# Patient Record
Sex: Female | Born: 1990 | Race: Black or African American | Hispanic: No | Marital: Single | Smoking: Never smoker
Health system: Southern US, Community
[De-identification: ages and names within clinical notes are randomized; demographics above are authoritative.]

## PROBLEM LIST (undated history)

## (undated) DIAGNOSIS — Z789 Other specified health status: Secondary | ICD-10-CM

## (undated) HISTORY — PX: NO PAST SURGERIES: SHX2092

---
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
## 2012-06-14 ENCOUNTER — Emergency Department (INDEPENDENT_AMBULATORY_CARE_PROVIDER_SITE_OTHER)
Admission: EM | Admit: 2012-06-14 | Discharge: 2012-06-14 | Disposition: A | Discharge status: Home / Self Care | Payer: Medicaid Other | Source: Home / Self Care | Attending: Emergency Medicine | Admitting: Emergency Medicine

## 2012-06-14 ENCOUNTER — Encounter (HOSPITAL_COMMUNITY): Payer: Self-pay

## 2012-06-14 DIAGNOSIS — N10 Acute tubulo-interstitial nephritis: Secondary | ICD-10-CM

## 2012-06-14 LAB — POCT URINALYSIS DIP (DEVICE)
Nitrite: NEGATIVE
Protein, ur: 100 mg/dL — AB
Urobilinogen, UA: 0.2 mg/dL (ref 0.0–1.0)
pH: 6 (ref 5.0–8.0)

## 2012-06-14 LAB — POCT I-STAT, CHEM 8
Calcium, Ion: 1.23 mmol/L (ref 1.12–1.23)
Chloride: 107 mEq/L (ref 96–112)
HCT: 42 % (ref 36.0–46.0)
Sodium: 142 mEq/L (ref 135–145)

## 2012-06-14 MED ORDER — CEFTRIAXONE SODIUM 1 G IJ SOLR
INTRAMUSCULAR | Status: AC
  Filled 2012-06-14: qty 10

## 2012-06-14 MED ORDER — HYDROCODONE-ACETAMINOPHEN 5-325 MG PO TABS: 2.0000 | ORAL_TABLET | ORAL | Status: DC | PRN

## 2012-06-14 MED ORDER — CEPHALEXIN 500 MG PO CAPS: 500.0000 mg | ORAL_CAPSULE | Freq: Four times a day (QID) | ORAL | Status: AC

## 2012-06-14 MED ORDER — LIDOCAINE HCL (PF) 1 % IJ SOLN
INTRAMUSCULAR | Status: AC
  Filled 2012-06-14: qty 5

## 2012-06-14 MED ORDER — ONDANSETRON HCL 4 MG PO TABS: 4.0000 mg | ORAL_TABLET | Freq: Three times a day (TID) | ORAL | Status: DC | PRN

## 2012-06-14 MED ORDER — CEFTRIAXONE SODIUM 1 G IJ SOLR
1.0000 g | Freq: Once | INTRAMUSCULAR | Status: AC
  Administered 2012-06-14: 1 g via INTRAMUSCULAR

## 2012-06-14 NOTE — ED Notes (Signed)
C/o pain in her right side, flank for few days, accompanied by vomiting and fever; NAD at present

## 2012-06-14 NOTE — ED Provider Notes (Signed)
History     CSN: [id number]  Arrival date & time 06/14/12  1614   First MD Initiated Contact with Patient 06/14/12 1701      Chief Complaint  Patient presents with  . Flank Pain    (Consider location/radiation/quality/duration/timing/severity/associated sxs/prior treatment) HPI Comments: Patient presents to urgent care this afternoon complaining of discomfort and pressure with urination for the last 3-4 days. Since yesterday she's been noticing some discomfort on her right flank area. She has been feeling worse throughout the day now feeling nauseous and vomited at one and having tactile fevers at home. She continues to have discomfort and pressure time she urinates. Patient denies any diarrheas, shortness of breath, or any recent falls or injuries. Patient also denies any vaginal discharge or bleeding. Patient denies any concerns of a potential STD exposure  Patient is a 22 y.o. female presenting with flank pain. The history is provided by the patient and a parent.  Flank Pain This is a new problem. The current episode started 2 days ago. The problem occurs constantly. The problem has been gradually worsening. Associated symptoms include abdominal pain. Pertinent negatives include no chest pain, no headaches and no shortness of breath. Exacerbated by: Urination. She has tried nothing for the symptoms. The treatment provided no relief.    History reviewed. No pertinent past medical history.  History reviewed. No pertinent past surgical history.  History reviewed. No pertinent family history.  History  Substance Use Topics  . Smoking status: Not on file  . Smokeless tobacco: Not on file  . Alcohol Use: Not on file    OB History    Grav Para Term Preterm Abortions TAB SAB Ect Mult Living                  Review of Systems  Constitutional: Positive for fever, chills and activity change. Negative for unexpected weight change.  Respiratory: Negative for shortness of breath.     Cardiovascular: Negative for chest pain.  Gastrointestinal: Positive for abdominal pain. Negative for nausea, vomiting, diarrhea, constipation, blood in stool, anal bleeding and rectal pain.  Genitourinary: Positive for dysuria and flank pain. Negative for hematuria, vaginal bleeding, vaginal discharge, difficulty urinating, vaginal pain, menstrual problem and dyspareunia.  Skin: Negative for rash.  Neurological: Negative for headaches.    Allergies  Penicillins  Home Medications   Current Outpatient Rx  Name  Route  Sig  Dispense  Refill  . CEPHALEXIN 500 MG PO CAPS   Oral   Take 1 capsule (500 mg total) by mouth 4 (four) times daily.   40 capsule   0   . HYDROCODONE-ACETAMINOPHEN 5-325 MG PO TABS   Oral   Take 2 tablets by mouth every 4 (four) hours as needed for pain.   10 tablet   0   . ONDANSETRON HCL 4 MG PO TABS   Oral   Take 1 tablet (4 mg total) by mouth every 8 (eight) hours as needed for nausea.   20 tablet   0     BP 117/76  Pulse 84  Temp 98.1 F (36.7 C) (Oral)  Resp 16  SpO2 98%  LMP 06/04/2012  Physical Exam  Nursing note and vitals reviewed. Constitutional: She appears well-developed and well-nourished.  Pulmonary/Chest: Effort normal.  Abdominal: Soft. She exhibits no distension and no mass. There is no hepatosplenomegaly or hepatomegaly. There is tenderness in the suprapubic area. There is CVA tenderness. There is no rebound and no guarding.    Neurological:  She is alert.  Skin: No rash noted. No erythema.    ED Course  Procedures (including critical care time)  Labs Reviewed  POCT URINALYSIS DIP (DEVICE) - Abnormal; Notable for the following:    Bilirubin Urine SMALL (*)     Ketones, ur >=160 (*)     Hgb urine dipstick LARGE (*)     Protein, ur 100 (*)     Leukocytes, UA TRACE (*)  Biochemical Testing Only. Please order routine urinalysis from main lab if confirmatory testing is needed.   All other components within normal limits   POCT PREGNANCY, URINE  POCT I-STAT, CHEM 8  URINE CULTURE   No results found.   1. Acute pyelonephritis    Negative pregnancy test -   Strangely CBC with differential ordered an only hematocrit and hemoglobin available within normal   MDM  Symptoms and exam consistent with early pyelonephritis. Patient has been provided with a gram of citrus on. Is able to tolerate oral fluids. Normal kidney function. Will followup within next 24-48 hours. Patient instructed to return tomorrow for a brief recheck and I will followup on Sunday. Cultures have been sent and patient has been instructed to continue with Keflex tonight. Patient and mother were advised and instructed that if worsening symptoms including increased pain, vomiting or unable to take the prescribed medicines she should go immediately to the emergency department for further treatment     Jimmie Molly, MD 06/14/12 2023

## 2012-06-16 ENCOUNTER — Emergency Department (HOSPITAL_COMMUNITY): Payer: Medicaid Other

## 2012-06-16 ENCOUNTER — Encounter (HOSPITAL_COMMUNITY): Payer: Self-pay | Admitting: *Deleted

## 2012-06-16 ENCOUNTER — Emergency Department (HOSPITAL_COMMUNITY)
Admission: EM | Admit: 2012-06-16 | Discharge: 2012-06-16 | Disposition: A | Discharge status: Home / Self Care | Payer: Medicaid Other | Attending: Emergency Medicine | Admitting: Emergency Medicine

## 2012-06-16 DIAGNOSIS — N390 Urinary tract infection, site not specified: Secondary | ICD-10-CM | POA: Insufficient documentation

## 2012-06-16 DIAGNOSIS — Z3202 Encounter for pregnancy test, result negative: Secondary | ICD-10-CM | POA: Insufficient documentation

## 2012-06-16 DIAGNOSIS — R3915 Urgency of urination: Secondary | ICD-10-CM | POA: Insufficient documentation

## 2012-06-16 DIAGNOSIS — R6883 Chills (without fever): Secondary | ICD-10-CM | POA: Insufficient documentation

## 2012-06-16 DIAGNOSIS — N949 Unspecified condition associated with female genital organs and menstrual cycle: Secondary | ICD-10-CM | POA: Insufficient documentation

## 2012-06-16 DIAGNOSIS — R112 Nausea with vomiting, unspecified: Secondary | ICD-10-CM | POA: Insufficient documentation

## 2012-06-16 DIAGNOSIS — M549 Dorsalgia, unspecified: Secondary | ICD-10-CM | POA: Insufficient documentation

## 2012-06-16 DIAGNOSIS — N200 Calculus of kidney: Secondary | ICD-10-CM | POA: Insufficient documentation

## 2012-06-16 LAB — URINE MICROSCOPIC-ADD ON

## 2012-06-16 LAB — CBC WITH DIFFERENTIAL/PLATELET
Basophils Absolute: 0 10*3/uL (ref 0.0–0.1)
HCT: 40.5 % (ref 36.0–46.0)
Lymphocytes Relative: 31 % (ref 12–46)
Monocytes Absolute: 0.4 10*3/uL (ref 0.1–1.0)
Neutro Abs: 2.6 10*3/uL (ref 1.7–7.7)
Platelets: 264 10*3/uL (ref 150–400)
RBC: 4.54 MIL/uL (ref 3.87–5.11)
RDW: 11.6 % (ref 11.5–15.5)
WBC: 4.4 10*3/uL (ref 4.0–10.5)

## 2012-06-16 LAB — COMPREHENSIVE METABOLIC PANEL
ALT: 19 U/L (ref 0–35)
AST: 26 U/L (ref 0–37)
CO2: 27 mEq/L (ref 19–32)
Chloride: 102 mEq/L (ref 96–112)
GFR calc non Af Amer: >90 mL/min (ref >90)
Sodium: 140 mEq/L (ref 135–145)
Total Bilirubin: 0.4 mg/dL (ref 0.3–1.2)

## 2012-06-16 LAB — POCT PREGNANCY, URINE: Preg Test, Ur: NEGATIVE

## 2012-06-16 LAB — URINE CULTURE: Culture: NO GROWTH

## 2012-06-16 LAB — URINALYSIS, ROUTINE W REFLEX MICROSCOPIC
Protein, ur: 100 mg/dL — AB
Urobilinogen, UA: 1 mg/dL (ref 0.0–1.0)

## 2012-06-16 LAB — WET PREP, GENITAL: Trich, Wet Prep: NONE SEEN

## 2012-06-16 LAB — LIPASE, BLOOD: Lipase: 42 U/L (ref 11–59)

## 2012-06-16 MED ORDER — ONDANSETRON HCL 4 MG PO TABS: 4.0000 mg | ORAL_TABLET | Freq: Four times a day (QID) | ORAL | Status: DC

## 2012-06-16 MED ORDER — HYDROMORPHONE HCL PF 1 MG/ML IJ SOLN
1.0000 mg | Freq: Once | INTRAMUSCULAR | Status: AC
Start: 2012-06-16 — End: 2012-06-16
  Administered 2012-06-16: 1 mg via INTRAVENOUS
  Filled 2012-06-16: qty 1

## 2012-06-16 MED ORDER — SODIUM CHLORIDE 0.9 % IV BOLUS (SEPSIS)
1000.0000 mL | Freq: Once | INTRAVENOUS | Status: AC
  Administered 2012-06-16: 1000 mL via INTRAVENOUS

## 2012-06-16 MED ORDER — ONDANSETRON HCL 4 MG/2ML IJ SOLN
4.0000 mg | Freq: Once | INTRAMUSCULAR | Status: AC
  Administered 2012-06-16: 4 mg via INTRAVENOUS
  Filled 2012-06-16: qty 2

## 2012-06-16 MED ORDER — OXYCODONE-ACETAMINOPHEN 5-325 MG PO TABS: 1.0000 | ORAL_TABLET | ORAL | Status: DC | PRN

## 2012-06-16 MED ORDER — DIPHENHYDRAMINE HCL 50 MG/ML IJ SOLN
25.0000 mg | Freq: Once | INTRAMUSCULAR | Status: AC
  Administered 2012-06-16: 25 mg via INTRAVENOUS
  Filled 2012-06-16: qty 1

## 2012-06-16 NOTE — ED Provider Notes (Signed)
History     CSN: [id number]  Arrival date & time 06/16/12  1211   First MD Initiated Contact with Patient 06/16/12 1220      Chief Complaint  Patient presents with  . Abdominal Pain    (Consider location/radiation/quality/duration/timing/severity/associated sxs/prior treatment) HPI Comments: 22 year old female presents to the ED with her mom after being seen at Urgent Care on 1/3 diagnosed with pyelonephritis. She was put on Keflex which she has started. Since leaving [facility] her symptoms have worsened. She developed lower abdominal pain, nausea, vomiting, and a "wierd" feeling in her vagina. Abdominal pain sharp in nature radiating to the right side of her back rated 10/10. Denies vaginal bleeding or discharge. No increased urinary frequency or urgency. Admits to chills. Pain and nausea medication are not providing relief.  The history is provided by the patient and a parent.    History reviewed. No pertinent past medical history.  History reviewed. No pertinent past surgical history.  No family history on file.  History  Substance Use Topics  . Smoking status: Never Smoker   . Smokeless tobacco: Not on file  . Alcohol Use: No    OB History    Grav Para Term Preterm Abortions TAB SAB Ect Mult Living                  Review of Systems  Constitutional: Positive for chills. Negative for fever.  Gastrointestinal: Positive for nausea, vomiting and abdominal pain.  Genitourinary: Positive for urgency, vaginal pain and pelvic pain. Negative for dysuria, hematuria and difficulty urinating.  Musculoskeletal: Positive for back pain.  All other systems reviewed and are negative.    Allergies  Penicillins  Home Medications   Current Outpatient Rx  Name  Route  Sig  Dispense  Refill  . CEPHALEXIN 500 MG PO CAPS   Oral   Take 1 capsule (500 mg total) by mouth 4 (four) times daily.   40 capsule   0   . HYDROCODONE-ACETAMINOPHEN 5-325 MG PO TABS   Oral   Take 2 tablets by  mouth every 4 (four) hours as needed for pain.   10 tablet   0   . ONDANSETRON HCL 4 MG PO TABS   Oral   Take 1 tablet (4 mg total) by mouth every 8 (eight) hours as needed for nausea.   20 tablet   0     BP 129/93  Pulse 80  Temp 98 F (36.7 C) (Oral)  Resp 18  SpO2 95%  LMP 06/04/2012  Physical Exam  Constitutional: She is oriented to person, place, and time. She appears well-developed and well-nourished. No distress.       Appears uncomfortable, tearful.  HENT:  Head: Normocephalic and atraumatic.  Mouth/Throat: Oropharynx is clear and moist.  Eyes: Conjunctivae normal are normal.  Neck: Normal range of motion. Neck supple.  Cardiovascular: Normal rate, regular rhythm and normal heart sounds.   Pulmonary/Chest: Effort normal and breath sounds normal.  Abdominal: Soft. Bowel sounds are normal. There is generalized tenderness (worse suprapubic, epigastric and RUQ). There is guarding and CVA tenderness (right). There is no rigidity and no rebound.       No peritoneal signs.  Genitourinary: Uterus normal. Cervix exhibits no motion tenderness, no discharge and no friability. Right adnexum displays no mass, no tenderness and no fullness. Left adnexum displays no mass, no tenderness and no fullness. No erythema, tenderness or bleeding around the vagina. Vaginal discharge (scant, malodorous, clear/white) found.  Musculoskeletal: Normal range  of motion. She exhibits no edema.  Neurological: She is alert and oriented to person, place, and time.  Skin: Skin is warm and dry.  Psychiatric: She has a normal mood and affect. Her behavior is normal.    ED Course  Procedures (including critical care time)  Labs Reviewed  COMPREHENSIVE METABOLIC PANEL - Abnormal; Notable for the following:    Potassium 3.3 (*)     Total Protein 8.4 (*)     All other components within normal limits  URINALYSIS, ROUTINE W REFLEX MICROSCOPIC - Abnormal; Notable for the following:    Color, Urine AMBER  (*)  BIOCHEMICALS MAY BE AFFECTED BY COLOR   APPearance TURBID (*)     Specific Gravity, Urine 1.045 (*)     Hgb urine dipstick LARGE (*)     Bilirubin Urine SMALL (*)     Ketones, ur 15 (*)     Protein, ur 100 (*)     Leukocytes, UA SMALL (*)     All other components within normal limits  WET PREP, GENITAL - Abnormal; Notable for the following:    WBC, Wet Prep HPF POC MODERATE (*)     All other components within normal limits  URINE MICROSCOPIC-ADD ON - Abnormal; Notable for the following:    Bacteria, UA MANY (*)     Crystals CA OXALATE CRYSTALS (*)     All other components within normal limits  CBC WITH DIFFERENTIAL  LIPASE, BLOOD  POCT PREGNANCY, URINE  GC/CHLAMYDIA PROBE AMP  URINE CULTURE   Ct Abdomen Pelvis Wo Contrast  06/16/2012  *RADIOLOGY REPORT*  Clinical Data: Right flank pain.  Nausea and vomiting.  CT ABDOMEN AND PELVIS WITHOUT CONTRAST  Technique:  Multidetector CT imaging of the abdomen and pelvis was performed following the standard protocol without intravenous contrast.  Comparison: None.  Findings: Approximate 4 mm calculus at the right ureterovesical junction causing mild right hydronephrosis, moderate right renal edema, and minimal right perinephric edema.  No urinary tract calculi elsewhere on the right.  Tiny (1-2 mm) calculus in a lower pole calix of the left kidney.  Hyperdense renal tubules.  Within the limits of the unenhanced technique, no focal parenchymal abnormality involving either kidney.  Normal unenhanced appearance of the liver with an anatomic variant in that the left lobe extends well across the midline into the left upper quadrant.  Normal-appearing spleen, pancreas, adrenal glands, and gallbladder.  No biliary ductal dilation.  No visible aorto- iliofemoral atherosclerosis.  No significant lymphadenopathy.  Normal-appearing stomach, small bowel, and colon.  Normal appendix in the right mid pelvis.  No ascites.  Uterus and ovaries unremarkable for age.   Urinary bladder decompressed and unremarkable.  No free pelvic fluid.  Bone window images unremarkable.  Visualized lung bases clear.  IMPRESSION:  1.  Obstructing approximate 4 mm calculus at the right UVJ. 2.  Tiny (1-2 mm) non-obstructing left lower pole renal calculus. 3.  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   Original Report Authenticated By: Hulan Saas, M.D.      1. Kidney stone   2. 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 Pain and nausea improved with dilaudid and zofran. She will continue antibiotic for UTI prescribed by urgent care. Percocet and zofran given along with urine strainer. Advised f/u with  nephrology for evaluation of renal tubular ectasia and urology for her stones. Return precautions discussed in detail. Patient states understanding of plan and is agreeable. She is in NAD and stable for discharge.        Trevor Mace, PA-C 06/16/12 1452

## 2012-06-16 NOTE — ED Provider Notes (Signed)
Medical screening examination/treatment/procedure(s) were performed by non-physician practitioner and as supervising physician I was immediately available for consultation/collaboration.  Rianna Lukes, MD 06/16/12 2127 

## 2012-06-16 NOTE — ED Notes (Signed)
Pt was seen yesterday at [facility] and diagnosed with acute pyelonephritis and back with right lateral side pain with vomiting.  Pt denies vaginal discharge or bleeding but states it feels weird down there.

## 2012-06-16 NOTE — ED Notes (Signed)
Pt in CT.

## 2012-06-16 NOTE — ED Notes (Signed)
Patient family reports patient is itching all over,  erpa made aware and to order benadryl

## 2012-06-17 LAB — URINE CULTURE: Colony Count: NO GROWTH

## 2012-06-18 LAB — GC/CHLAMYDIA PROBE AMP: CT Probe RNA: POSITIVE — AB

## 2012-06-22 ENCOUNTER — Telehealth (HOSPITAL_COMMUNITY): Payer: Self-pay | Admitting: Emergency Medicine

## 2012-06-22 NOTE — ED Notes (Signed)
+  Chlamydia Chart sent to EDP office for review.  

## 2012-06-22 NOTE — ED Notes (Signed)
Rx called in to [facility] on [address] by Norm Parcel PFM.

## 2012-06-22 NOTE — ED Notes (Signed)
Chart returned from EDP office. Prescribed Azithromycin 1 gram PO x 1. #1. No refills. Prescribed by Hannah Muthersbaugh PA-C. °

## 2012-12-17 ENCOUNTER — Ambulatory Visit (INDEPENDENT_AMBULATORY_CARE_PROVIDER_SITE_OTHER): Payer: Medicaid Other | Admitting: Family Medicine

## 2012-12-17 ENCOUNTER — Encounter: Payer: Self-pay | Admitting: Family Medicine

## 2012-12-17 VITALS — BP 100/78 | HR 62 | Temp 97.9°F | Resp 14 | Wt 105.0 lb

## 2012-12-17 DIAGNOSIS — N6310 Unspecified lump in the right breast, unspecified quadrant: Secondary | ICD-10-CM

## 2012-12-17 DIAGNOSIS — N63 Unspecified lump in unspecified breast: Secondary | ICD-10-CM

## 2012-12-17 NOTE — Progress Notes (Signed)
  Subjective:    Patient ID: Krista Barnes, female    DOB: 09-13-1990, 22 y.o.   MRN: [id number]  HPI  Patient reports a 9 month history of a painful lump in her right breast. This was evaluated at another clinic and she was told it was fibrocystic changes. She was placed on control pills. This has not helped. The lump seems to be growing.  She states that it is enlarging and now painful with movement. The pain radiates to her right axilla. She denies any association of the pain to her menstrual cycles over to caffeine/dietary intake.  She has no family history of breast cancer. No past medical history on file. No current outpatient prescriptions on file prior to visit.   No current facility-administered medications on file prior to visit.   Allergies  Allergen Reactions  . Penicillins Hives   History   Social History  . Marital Status: Single    Spouse Name: N/A    Number of Children: N/A  . Years of Education: N/A   Occupational History  . Not on file.   Social History Main Topics  . Smoking status: Never Smoker   . Smokeless tobacco: Not on file  . Alcohol Use: No  . Drug Use: No  . Sexually Active:    Other Topics Concern  . Not on file   Social History Narrative  . No narrative on file     Review of Systems  All other systems reviewed and are negative.       Objective:   Physical Exam  Pulmonary/Chest: Right breast exhibits mass and tenderness. Right breast exhibits no inverted nipple, no nipple discharge and no skin change. Left breast exhibits no inverted nipple, no mass, no skin change and no tenderness. Breasts are symmetrical.     there is a 1 cm cystic well-circumscribed lesion in the right breast at approximately 7:00 as diagrammed it has the feel of a cyst versus a fibroadenoma.        Assessment & Plan:  1. Mass of right breast I believe this is normal fibrocystic changes versus more likely a fibroadenoma.  Any rate I believe it is likely  benign. I will schedule an ultrasound of the right breast to ensure that it is benign and for the patient's peace of mind. - US Breast Right; Future

## 2012-12-26 ENCOUNTER — Other Ambulatory Visit: Payer: Self-pay | Admitting: Family Medicine

## 2012-12-26 ENCOUNTER — Ambulatory Visit
Admission: RE | Admit: 2012-12-26 | Discharge: 2012-12-26 | Disposition: A | Discharge status: Home / Self Care | Payer: Medicaid Other | Source: Ambulatory Visit | Attending: Family Medicine | Admitting: Family Medicine

## 2012-12-26 DIAGNOSIS — N6310 Unspecified lump in the right breast, unspecified quadrant: Secondary | ICD-10-CM

## 2013-01-14 ENCOUNTER — Ambulatory Visit
Admission: RE | Admit: 2013-01-14 | Discharge: 2013-01-14 | Disposition: A | Discharge status: Home / Self Care | Payer: Medicaid Other | Source: Ambulatory Visit | Attending: Family Medicine | Admitting: Family Medicine

## 2013-01-14 DIAGNOSIS — N6310 Unspecified lump in the right breast, unspecified quadrant: Secondary | ICD-10-CM

## 2013-01-21 ENCOUNTER — Encounter: Payer: Self-pay | Admitting: Family Medicine

## 2013-06-13 ENCOUNTER — Encounter (HOSPITAL_COMMUNITY): Payer: Self-pay | Admitting: Emergency Medicine

## 2013-06-13 DIAGNOSIS — Z87442 Personal history of urinary calculi: Secondary | ICD-10-CM | POA: Insufficient documentation

## 2013-06-13 DIAGNOSIS — Z3202 Encounter for pregnancy test, result negative: Secondary | ICD-10-CM | POA: Insufficient documentation

## 2013-06-13 DIAGNOSIS — N390 Urinary tract infection, site not specified: Secondary | ICD-10-CM | POA: Insufficient documentation

## 2013-06-13 DIAGNOSIS — R112 Nausea with vomiting, unspecified: Secondary | ICD-10-CM | POA: Insufficient documentation

## 2013-06-13 DIAGNOSIS — Z880 Allergy status to penicillin: Secondary | ICD-10-CM | POA: Insufficient documentation

## 2013-06-13 LAB — URINE MICROSCOPIC-ADD ON

## 2013-06-13 LAB — CBC WITH DIFFERENTIAL/PLATELET
BASOS ABS: 0 10*3/uL (ref 0.0–0.1)
Basophils Relative: 1 % (ref 0–1)
EOS PCT: 1 % (ref 0–5)
Eosinophils Absolute: 0 10*3/uL (ref 0.0–0.7)
HCT: 41.4 % (ref 36.0–46.0)
Hemoglobin: 14.3 g/dL (ref 12.0–15.0)
LYMPHS ABS: 1.9 10*3/uL (ref 0.7–4.0)
LYMPHS PCT: 41 % (ref 12–46)
MCH: 32 pg (ref 26.0–34.0)
MCHC: 34.5 g/dL (ref 30.0–36.0)
MCV: 92.6 fL (ref 78.0–100.0)
Monocytes Absolute: 0.4 10*3/uL (ref 0.1–1.0)
Monocytes Relative: 8 % (ref 3–12)
NEUTROS ABS: 2.4 10*3/uL (ref 1.7–7.7)
NEUTROS PCT: 51 % (ref 43–77)
PLATELETS: 279 10*3/uL (ref 150–400)
RBC: 4.47 MIL/uL (ref 3.87–5.11)
RDW: 12 % (ref 11.5–15.5)
WBC: 4.6 10*3/uL (ref 4.0–10.5)

## 2013-06-13 LAB — COMPREHENSIVE METABOLIC PANEL
ALK PHOS: 64 U/L (ref 39–117)
ALT: 12 U/L (ref 0–35)
AST: 17 U/L (ref 0–37)
Albumin: 4.2 g/dL (ref 3.5–5.2)
BILIRUBIN TOTAL: 0.4 mg/dL (ref 0.3–1.2)
BUN: 16 mg/dL (ref 6–23)
CALCIUM: 9.5 mg/dL (ref 8.4–10.5)
CHLORIDE: 102 meq/L (ref 96–112)
CO2: 31 meq/L (ref 19–32)
Creatinine, Ser: 0.65 mg/dL (ref 0.50–1.10)
GLUCOSE: 89 mg/dL (ref 70–99)
POTASSIUM: 3.7 meq/L (ref 3.7–5.3)
SODIUM: 141 meq/L (ref 137–147)
Total Protein: 8.4 g/dL — ABNORMAL HIGH (ref 6.0–8.3)

## 2013-06-13 LAB — URINALYSIS, ROUTINE W REFLEX MICROSCOPIC
BILIRUBIN URINE: NEGATIVE
Glucose, UA: NEGATIVE mg/dL
Hgb urine dipstick: NEGATIVE
Ketones, ur: 15 mg/dL — AB
NITRITE: NEGATIVE
PH: 6.5 (ref 5.0–8.0)
Protein, ur: NEGATIVE mg/dL
SPECIFIC GRAVITY, URINE: 1.029 (ref 1.005–1.030)
UROBILINOGEN UA: 1 mg/dL (ref 0.0–1.0)

## 2013-06-13 LAB — PREGNANCY, URINE: PREG TEST UR: NEGATIVE

## 2013-06-13 NOTE — ED Notes (Signed)
The pt is c/o lt flnak and lt lower abd pain for 1-2 weeks with nausea.  lmp  This past tuesday

## 2013-06-13 NOTE — ED Notes (Signed)
Urinary frequency

## 2013-06-14 ENCOUNTER — Emergency Department (HOSPITAL_COMMUNITY)
Admission: EM | Admit: 2013-06-14 | Discharge: 2013-06-14 | Disposition: A | Discharge status: Home / Self Care | Payer: BC Managed Care – PPO | Attending: Emergency Medicine | Admitting: Emergency Medicine

## 2013-06-14 DIAGNOSIS — R109 Unspecified abdominal pain: Secondary | ICD-10-CM

## 2013-06-14 DIAGNOSIS — N390 Urinary tract infection, site not specified: Secondary | ICD-10-CM

## 2013-06-14 LAB — URINE CULTURE
COLONY COUNT: NO GROWTH
CULTURE: NO GROWTH

## 2013-06-14 MED ORDER — IBUPROFEN 400 MG PO TABS
400.0000 mg | ORAL_TABLET | Freq: Once | ORAL | Status: AC
  Administered 2013-06-14: 400 mg via ORAL
  Filled 2013-06-14: qty 1

## 2013-06-14 MED ORDER — SULFAMETHOXAZOLE-TMP DS 800-160 MG PO TABS
1.0000 | ORAL_TABLET | Freq: Two times a day (BID) | ORAL | Status: DC
Start: 2013-06-14 — End: 2013-12-11

## 2013-06-14 MED ORDER — ONDANSETRON 8 MG PO TBDP: 8.0000 mg | ORAL_TABLET | Freq: Three times a day (TID) | ORAL | Status: DC | PRN

## 2013-06-14 MED ORDER — OXYCODONE-ACETAMINOPHEN 5-325 MG PO TABS: 1.0000 | ORAL_TABLET | Freq: Four times a day (QID) | ORAL | Status: DC | PRN

## 2013-06-14 MED ORDER — OXYCODONE-ACETAMINOPHEN 5-325 MG PO TABS
1.0000 | ORAL_TABLET | Freq: Once | ORAL | Status: AC
  Administered 2013-06-14: 1 via ORAL
  Filled 2013-06-14: qty 1

## 2013-06-14 MED ORDER — SULFAMETHOXAZOLE-TMP DS 800-160 MG PO TABS
1.0000 | ORAL_TABLET | Freq: Once | ORAL | Status: AC
  Administered 2013-06-14: 1 via ORAL
  Filled 2013-06-14: qty 1

## 2013-06-14 MED ORDER — ONDANSETRON 4 MG PO TBDP
8.0000 mg | ORAL_TABLET | Freq: Once | ORAL | Status: AC
  Administered 2013-06-14: 8 mg via ORAL
  Filled 2013-06-14: qty 2

## 2013-06-14 MED ORDER — IBUPROFEN 400 MG PO TABS: 400.0000 mg | ORAL_TABLET | Freq: Four times a day (QID) | ORAL | Status: DC | PRN

## 2013-06-14 NOTE — Discharge Instructions (Signed)
Take medications as prescribed.  Return to the ER for worsening condition or new concerning symptoms.  Drink plenty of fluids. Follow up with your doctor or with the number listed above for recheck in 5-7 days.   Abdominal Pain, Women Abdominal (stomach, pelvic, or belly) pain can be caused by many things. It is important to tell your doctor:  The location of the pain.  Does it come and go or is it present all the time?  Are there things that start the pain (eating certain foods, exercise)?  Are there other symptoms associated with the pain (fever, nausea, vomiting, diarrhea)? All of this is helpful to know when trying to find the cause of the pain. CAUSES   Stomach: virus or bacteria infection, or ulcer.  Intestine: appendicitis (inflamed appendix), regional ileitis (Crohn's disease), ulcerative colitis (inflamed colon), irritable bowel syndrome, diverticulitis (inflamed diverticulum of the colon), or cancer of the stomach or intestine.  Gallbladder disease or stones in the gallbladder.  Kidney disease, kidney stones, or infection.  Pancreas infection or cancer.  Fibromyalgia (pain disorder).  Diseases of the female organs:  Uterus: fibroid (non-cancerous) tumors or infection.  Fallopian tubes: infection or tubal pregnancy.  Ovary: cysts or tumors.  Pelvic adhesions (scar tissue).  Endometriosis (uterus lining tissue growing in the pelvis and on the pelvic organs).  Pelvic congestion syndrome (female organs filling up with blood just before the menstrual period).  Pain with the menstrual period.  Pain with ovulation (producing an egg).  Pain with an IUD (intrauterine device, birth control) in the uterus.  Cancer of the female organs.  Functional pain (pain not caused by a disease, may improve without treatment).  Psychological pain.  Depression. DIAGNOSIS  Your doctor will decide the seriousness of your pain by doing an examination.  Blood  tests.  X-rays.  Ultrasound.  CT scan (computed tomography, special type of X-ray).  MRI (magnetic resonance imaging).  Cultures, for infection.  Barium enema (dye inserted in the large intestine, to better view it with X-rays).  Colonoscopy (looking in intestine with a lighted tube).  Laparoscopy (minor surgery, looking in abdomen with a lighted tube).  Major abdominal exploratory surgery (looking in abdomen with a large incision). TREATMENT  The treatment will depend on the cause of the pain.   Many cases can be observed and treated at home.  Over-the-counter medicines recommended by your caregiver.  Prescription medicine.  Antibiotics, for infection.  Birth control pills, for painful periods or for ovulation pain.  Hormone treatment, for endometriosis.  Nerve blocking injections.  Physical therapy.  Antidepressants.  Counseling with a psychologist or psychiatrist.  Minor or major surgery. HOME CARE INSTRUCTIONS   Do not take laxatives, unless directed by your caregiver.  Take over-the-counter pain medicine only if ordered by your caregiver. Do not take aspirin because it can cause an upset stomach or bleeding.  Try a clear liquid diet (broth or water) as ordered by your caregiver. Slowly move to a bland diet, as tolerated, if the pain is related to the stomach or intestine.  Have a thermometer and take your temperature several times a day, and record it.  Bed rest and sleep, if it helps the pain.  Avoid sexual intercourse, if it causes pain.  Avoid stressful situations.  Keep your follow-up appointments and tests, as your caregiver orders.  If the pain does not go away with medicine or surgery, you may try:  Acupuncture.  Relaxation exercises (yoga, meditation).  Group therapy.  Counseling. SEEK  MEDICAL CARE IF:   You notice certain foods cause stomach pain.  Your home care treatment is not helping your pain.  You need stronger pain  medicine.  You want your IUD removed.  You feel faint or lightheaded.  You develop nausea and vomiting.  You develop a rash.  You are having side effects or an allergy to your medicine. SEEK IMMEDIATE MEDICAL CARE IF:   Your pain does not go away or gets worse.  You have a fever.  Your pain is felt only in portions of the abdomen. The right side could possibly be appendicitis. The left lower portion of the abdomen could be colitis or diverticulitis.  You are passing blood in your stools (bright red or black tarry stools, with or without vomiting).  You have blood in your urine.  You develop chills, with or without a fever.  You pass out. MAKE SURE YOU:   Understand these instructions.  Will watch your condition.  Will get help right away if you are not doing well or get worse. Document Released: 03/26/2007 Document Revised: 08/21/2011 Document Reviewed: 04/15/2009 [facility] Patient Information 2014 [place], [place].  Flank Pain Flank pain refers to pain that is located on the side of the body between the upper abdomen and the back. The pain may occur over a short period of time (acute) or may be long-term or reoccurring (chronic). It may be mild or severe. Flank pain can be caused by many things. CAUSES  Some of the more common causes of flank pain include:  Muscle strains.   Muscle spasms.   A disease of your spine (vertebral disk disease).   A lung infection (pneumonia).   Fluid around your lungs (pulmonary edema).   A kidney infection.   Kidney stones.   A very painful skin rash caused by the chickenpox virus (shingles).   Gallbladder disease.  HOME CARE INSTRUCTIONS  Home care will depend on the cause of your pain. In general,  Rest as directed by your caregiver.  Drink enough fluids to keep your urine clear or pale yellow.  Only take over-the-counter or prescription medicines as directed by your caregiver. Some medicines may help relieve  the pain.  Tell your caregiver about any changes in your pain.  Follow up with your caregiver as directed. SEEK IMMEDIATE MEDICAL CARE IF:   Your pain is not controlled with medicine.   You have new or worsening symptoms.  Your pain increases.   You have abdominal pain.   You have shortness of breath.   You have persistent nausea or vomiting.   You have swelling in your abdomen.   You feel faint or pass out.   You have blood in your urine.  You have a fever or persistent symptoms for more than 2 3 days.  You have a fever and your symptoms suddenly get worse. MAKE SURE YOU:   Understand these instructions.  Will watch your condition.  Will get help right away if you are not doing well or get worse. Document Released: 07/20/2005 Document Revised: 02/21/2012 Document Reviewed: 01/11/2012 [facility] Patient Information 2014 [place], [place].  Urinary Tract Infection Urinary tract infections (UTIs) can develop anywhere along your urinary tract. Your urinary tract is your body's drainage system for removing wastes and extra water. Your urinary tract includes two kidneys, two ureters, a bladder, and a urethra. Your kidneys are a pair of bean-shaped organs. Each kidney is about the size of your fist. They are located below your ribs, one  on each side of your spine. CAUSES Infections are caused by microbes, which are microscopic organisms, including fungi, viruses, and bacteria. These organisms are so small that they can only be seen through a microscope. Bacteria are the microbes that most commonly cause UTIs. SYMPTOMS  Symptoms of UTIs may vary by age and gender of the patient and by the location of the infection. Symptoms in young women typically include a frequent and intense urge to urinate and a painful, burning feeling in the bladder or urethra during urination. Older women and men are more likely to be tired, shaky, and weak and have muscle aches and abdominal pain. A  fever may mean the infection is in your kidneys. Other symptoms of a kidney infection include pain in your back or sides below the ribs, nausea, and vomiting. DIAGNOSIS To diagnose a UTI, your caregiver will ask you about your symptoms. Your caregiver also will ask to provide a urine sample. The urine sample will be tested for bacteria and white blood cells. White blood cells are made by your body to help fight infection. TREATMENT  Typically, UTIs can be treated with medication. Because most UTIs are caused by a bacterial infection, they usually can be treated with the use of antibiotics. The choice of antibiotic and length of treatment depend on your symptoms and the type of bacteria causing your infection. HOME CARE INSTRUCTIONS  If you were prescribed antibiotics, take them exactly as your caregiver instructs you. Finish the medication even if you feel better after you have only taken some of the medication.  Drink enough water and fluids to keep your urine clear or pale yellow.  Avoid caffeine, tea, and carbonated beverages. They tend to irritate your bladder.  Empty your bladder often. Avoid holding urine for long periods of time.  Empty your bladder before and after sexual intercourse.  After a bowel movement, women should cleanse from front to back. Use each tissue only once. SEEK MEDICAL CARE IF:   You have back pain.  You develop a fever.  Your symptoms do not begin to resolve within 3 days. SEEK IMMEDIATE MEDICAL CARE IF:   You have severe back pain or lower abdominal pain.  You develop chills.  You have nausea or vomiting.  You have continued burning or discomfort with urination. MAKE SURE YOU:   Understand these instructions.  Will watch your condition.  Will get help right away if you are not doing well or get worse. Document Released: 03/08/2005 Document Revised: 11/28/2011 Document Reviewed: 07/07/2011 [facility] Patient Information 2014 [place], [place].

## 2013-06-14 NOTE — ED Provider Notes (Signed)
CSN: [id number]     Arrival date & time 06/13/13  2100 History   First MD Initiated Contact with Patient 06/14/13 0040     Chief Complaint  Patient presents with  . Abdominal Pain   (Consider location/radiation/quality/duration/timing/severity/associated sxs/prior Treatment) HPI 23 yo female presents to the ER from home with complaint of 2 weeks of left flank pain.  Pain is in left upper flank, LUQ, and llq.  Pt attributed pain to premenstrual symptoms, but she has since finished her period and pain has persisted.  No sexual activity for 1.5 years.  No vaginal d/c.  No urinary symptoms.  Prior h/o kidney stone, but sxs not like prior pain.  Reports normal bm.  No fevers, chills.  Nausea and vomiting x 5 days, vomiting about twice a day.  Pain in LLQ has been intermittent, sharp.  Pain in LUQ/flank constant and dull.   History reviewed. No pertinent past medical history. History reviewed. No pertinent past surgical history. No family history on file. History  Substance Use Topics  . Smoking status: Never Smoker   . Smokeless tobacco: Not on file  . Alcohol Use: No   OB History   Grav Para Term Preterm Abortions TAB SAB Ect Mult Living                 Review of Systems  See History of Present Illness; otherwise all other systems are reviewed and negative Allergies  Penicillins  Home Medications   Current Outpatient Rx  Name  Route  Sig  Dispense  Refill  . ibuprofen (ADVIL,MOTRIN) 200 MG tablet   Oral   Take 600 mg by mouth daily as needed (pain).          BP 103/68  Pulse 68  Temp(Src) 97.6 F (36.4 C) (Oral)  Resp 16  Ht 5\' 3"  (1.6 m)  Wt 112 lb 9 oz (51.058 kg)  BMI 19.94 kg/m2  SpO2 100%  LMP 06/10/2013 Physical Exam  Nursing note and vitals reviewed. Constitutional: She is oriented to person, place, and time. She appears well-developed and well-nourished.  HENT:  Head: Normocephalic and atraumatic.  Nose: Nose normal.  Mouth/Throat: Oropharynx is clear and  moist.  Eyes: Conjunctivae and EOM are normal. Pupils are equal, round, and reactive to light.  Neck: Normal range of motion. Neck supple. No JVD present. No tracheal deviation present. No thyromegaly present.  Cardiovascular: Normal rate, regular rhythm, normal heart sounds and intact distal pulses.  Exam reveals no gallop and no friction rub.   No murmur heard. Pulmonary/Chest: Effort normal and breath sounds normal. No stridor. No respiratory distress. She has no wheezes. She has no rales. She exhibits no tenderness.  Abdominal: Soft. Bowel sounds are normal. She exhibits no distension and no mass. There is tenderness (ttp in LUQ, llq). There is no rebound and no guarding.  Genitourinary:  External genitalia normal Vagina with discharge Cervix closed no lesions No cervical motion tenderness Adnexa palpated, no masses or tenderness noted Bladder palpated no tenderness Uterus palpated no masses or tenderness    Musculoskeletal: Normal range of motion. She exhibits no edema and no tenderness.  Lymphadenopathy:    She has no cervical adenopathy.  Neurological: She is alert and oriented to person, place, and time. She exhibits normal muscle tone. Coordination normal.  Skin: Skin is warm and dry. No rash noted. No erythema. No pallor.  Psychiatric: She has a normal mood and affect. Her behavior is normal. Judgment and thought content normal.  ED Course  Procedures (including critical care time) Labs Review Labs Reviewed  URINALYSIS, ROUTINE W REFLEX MICROSCOPIC - Abnormal; Notable for the following:    APPearance CLOUDY (*)    Ketones, ur 15 (*)    Leukocytes, UA TRACE (*)    All other components within normal limits  COMPREHENSIVE METABOLIC PANEL - Abnormal; Notable for the following:    Total Protein 8.4 (*)    All other components within normal limits  URINE MICROSCOPIC-ADD ON - Abnormal; Notable for the following:    Squamous Epithelial / LPF FEW (*)    Bacteria, UA FEW (*)     All other components within normal limits  URINE CULTURE  WET PREP, GENITAL  PREGNANCY, URINE  CBC WITH DIFFERENTIAL   Imaging Review No results found.  EKG Interpretation   None       MDM   1. Urinary tract infection   2. Left sided abdominal pain    23 yo female with 2 weeks of abd pain.  Mild UTI noted, but do not feel sxs are due to pyelo.  Pelvic normal.  Labs unremarkable.  Will tx uti, pain, have patient f/u with pcm or gyn for recheck in 1 week.    Olivia Mackielga M Bayne Fosnaugh, MD 06/14/13 [phone]

## 2013-06-14 NOTE — ED Notes (Signed)
C/o L flank pain, also some LUQ pain & vague mild HA, also some nv, "nausea mild", vomits ~ 2x per day, 2 episodes of emesis in last 24 hrs, last BM yesterday (normal), (denies: diarrhea, bleeding, dizziness, urinary sx or vaginal sx, itching, d/c, dysuria, frequency, urgency or other sx). No meds PTA, here with grandmother. Denies h/o abd surgeries. Last ate ~ 1830 (soup), last emesis this afternoon & this am.

## 2013-11-18 ENCOUNTER — Emergency Department (HOSPITAL_COMMUNITY)
Admission: EM | Admit: 2013-11-18 | Discharge: 2013-11-18 | Disposition: A | Discharge status: Home / Self Care | Payer: BC Managed Care – PPO | Source: Home / Self Care | Attending: Emergency Medicine | Admitting: Emergency Medicine

## 2013-11-18 ENCOUNTER — Encounter (HOSPITAL_COMMUNITY): Payer: Self-pay | Admitting: Emergency Medicine

## 2013-11-18 DIAGNOSIS — J069 Acute upper respiratory infection, unspecified: Secondary | ICD-10-CM

## 2013-11-18 MED ORDER — DOXYCYCLINE HYCLATE 50 MG PO CAPS: 100.0000 mg | ORAL_CAPSULE | Freq: Two times a day (BID) | ORAL | Status: DC

## 2013-11-18 MED ORDER — IBUPROFEN 600 MG PO TABS: 600.0000 mg | ORAL_TABLET | Freq: Four times a day (QID) | ORAL | Status: DC | PRN

## 2013-11-18 MED ORDER — GUAIFENESIN-CODEINE 100-10 MG/5ML PO SYRP: 5.0000 mL | ORAL_SOLUTION | Freq: Every evening | ORAL | Status: DC | PRN

## 2013-11-18 MED ORDER — FLUTICASONE PROPIONATE 50 MCG/ACT NA SUSP: 2.0000 | Freq: Every day | NASAL | Status: DC

## 2013-11-18 NOTE — ED Provider Notes (Signed)
CSN: [id number]     Arrival date & time 11/18/13  1659 History   None    Chief Complaint  Patient presents with  . URI   (Consider location/radiation/quality/duration/timing/severity/associated sxs/prior Treatment) HPI Patient is a 23 yo F presenting with URI and cough x 1 week ago. Reports she had a cold last week that she treated with OTC medications, but cough/fatigue is lasting a little bit longer. She reports she coughs up green sputum. Cough is worse in the last 24-48 hours and is associated with pain in chest and shortness of breath. No history of asthma. She does not smoke. She reports sick contacts at work. She reports fever to 101 last night. She reports decreased appetite. Drinking plenty of water.   History reviewed. No pertinent past medical history. History reviewed. No pertinent past surgical history. History reviewed. No pertinent family history. History  Substance Use Topics  . Smoking status: Never Smoker   . Smokeless tobacco: Not on file  . Alcohol Use: No   OB History   Grav Para Term Preterm Abortions TAB SAB Ect Mult Living                 Review of Systems  Constitutional: Positive for fever and fatigue. Negative for chills.  HENT: Positive for congestion, ear pain, postnasal drip, rhinorrhea and sore throat.   Eyes: Negative for visual disturbance.  Respiratory: Positive for cough and shortness of breath.   Cardiovascular: Positive for chest pain. Negative for leg swelling.  Gastrointestinal: Negative for abdominal pain.  Genitourinary: Negative for dysuria.  Musculoskeletal: Negative for arthralgias and myalgias.  Skin: Negative for rash.  Neurological: Negative for headaches.    Allergies  Penicillins  Home Medications   Prior to Admission medications   Medication Sig Start Date End Date Taking? Authorizing Provider  doxycycline (VIBRAMYCIN) 50 MG capsule Take 2 capsules (100 mg total) by mouth 2 (two) times daily. 11/18/13   Maikol Grassia Nydia BoutonM Jeanifer Halliday, MD   fluticasone (FLONASE) 50 MCG/ACT nasal spray Place 2 sprays into both nostrils daily. 11/18/13   Hamdi Kley Nydia BoutonM Asier Desroches, MD  guaiFENesin-codeine (ROBITUSSIN AC) 100-10 MG/5ML syrup Take 5 mLs by mouth at bedtime as needed for cough. 11/18/13   Alfredia Desanctis Nydia BoutonM Christasia Angeletti, MD  ibuprofen (ADVIL,MOTRIN) 200 MG tablet Take 600 mg by mouth daily as needed (pain).    Historical Provider, MD  ibuprofen (ADVIL,MOTRIN) 400 MG tablet Take 1 tablet (400 mg total) by mouth every 6 (six) hours as needed. 06/14/13   Olivia Mackielga M Otter, MD  ondansetron (ZOFRAN-ODT) 8 MG disintegrating tablet Take 1 tablet (8 mg total) by mouth every 8 (eight) hours as needed for nausea or vomiting. 06/14/13   Olivia Mackielga M Otter, MD  oxyCODONE-acetaminophen (PERCOCET/ROXICET) 5-325 MG per tablet Take 1 tablet by mouth every 6 (six) hours as needed for severe pain. 06/14/13   Olivia Mackielga M Otter, MD  sulfamethoxazole-trimethoprim (BACTRIM DS) 800-160 MG per tablet Take 1 tablet by mouth 2 (two) times daily. 06/14/13   Olivia Mackielga M Otter, MD   BP 115/79  Pulse 83  Temp(Src) 98.3 F (36.8 C) (Oral)  Resp 18  SpO2 97% Physical Exam  Constitutional: She is oriented to person, place, and time. She appears well-developed and well-nourished. No distress.  HENT:  Head: Normocephalic and atraumatic.  Mouth/Throat: No oropharyngeal exudate.  Posterior pharynx erythematous  Eyes: Pupils are equal, round, and reactive to light.  Neck: Normal range of motion. Neck supple.  Cardiovascular: Normal rate, regular rhythm and normal heart sounds.  No murmur heard. Pulmonary/Chest: Effort normal and breath sounds normal. She has no wheezes. She exhibits tenderness.  Abdominal: Soft. She exhibits no distension. There is no tenderness.  Musculoskeletal: Normal range of motion. She exhibits no edema and no tenderness.  Neurological: She is alert and oriented to person, place, and time.  Skin: Skin is warm and dry.  Psychiatric: She has a normal mood and affect.    ED Course  Procedures  (including critical care time) Labs Review Labs Reviewed - No data to display  Imaging Review No results found.  MDM   1. Protracted URI    Patient with prolonged illness with cough. Most likely secondary to post-nasal drip.  Continue symptomatic treatment with OTC cough medications Flonase daily for nasal congestion Robitussin AC for cough Will start Doxy for possible developing sinusitis or bronchitis due to length of illness Chest pain likely due to costochondritis due to coughing, however, she has been advised that if the pain does not improve, she should be seen for further work up (consider post-viral myocarditis, etc.) Ibuprofen 600mg  prn F/u if not improving or getting worse. Patient agrees with this plan.     Hilarie Fredrickson, MD 11/18/13 [phone]

## 2013-11-18 NOTE — ED Provider Notes (Signed)
Medical screening examination/treatment/procedure(s) were performed by a resident physician and as supervising physician I was immediately available for consultation/collaboration.  Leslee Home, M.D.  Reuben Likes, MD 11/18/13 2232

## 2013-11-18 NOTE — ED Notes (Signed)
C/o sick x 1 week. Everyone at her job has strep or a summer cold. C/o she feels fatigued easily after exertion

## 2013-11-18 NOTE — Discharge Instructions (Signed)
Use the flonase daily. Continue to use over the counter cold medication. Drink plenty of water. Consider starting antibiotic.  Use cough medication at nighttime only.  If your pain gets worse, you can't breathe, you get puffy or if your fever stays consistently over 102, please seek medical care.

## 2013-12-09 ENCOUNTER — Emergency Department (HOSPITAL_COMMUNITY)
Admission: EM | Admit: 2013-12-09 | Discharge: 2013-12-09 | Disposition: A | Discharge status: Home / Self Care | Payer: BC Managed Care – PPO | Attending: Emergency Medicine | Admitting: Emergency Medicine

## 2013-12-09 ENCOUNTER — Encounter (HOSPITAL_COMMUNITY): Payer: Self-pay | Admitting: Emergency Medicine

## 2013-12-09 DIAGNOSIS — Y92410 Unspecified street and highway as the place of occurrence of the external cause: Secondary | ICD-10-CM | POA: Insufficient documentation

## 2013-12-09 DIAGNOSIS — R112 Nausea with vomiting, unspecified: Secondary | ICD-10-CM | POA: Insufficient documentation

## 2013-12-09 DIAGNOSIS — Z79899 Other long term (current) drug therapy: Secondary | ICD-10-CM | POA: Insufficient documentation

## 2013-12-09 DIAGNOSIS — IMO0002 Reserved for concepts with insufficient information to code with codable children: Secondary | ICD-10-CM | POA: Insufficient documentation

## 2013-12-09 DIAGNOSIS — S335XXA Sprain of ligaments of lumbar spine, initial encounter: Secondary | ICD-10-CM | POA: Insufficient documentation

## 2013-12-09 DIAGNOSIS — R42 Dizziness and giddiness: Secondary | ICD-10-CM | POA: Insufficient documentation

## 2013-12-09 DIAGNOSIS — S0990XA Unspecified injury of head, initial encounter: Secondary | ICD-10-CM | POA: Insufficient documentation

## 2013-12-09 DIAGNOSIS — Y9389 Activity, other specified: Secondary | ICD-10-CM | POA: Insufficient documentation

## 2013-12-09 DIAGNOSIS — S39012A Strain of muscle, fascia and tendon of lower back, initial encounter: Secondary | ICD-10-CM

## 2013-12-09 DIAGNOSIS — Z880 Allergy status to penicillin: Secondary | ICD-10-CM | POA: Insufficient documentation

## 2013-12-09 MED ORDER — IBUPROFEN 200 MG PO TABS: 600.0000 mg | ORAL_TABLET | Freq: Once | ORAL | Status: DC
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
## 2013-12-11 ENCOUNTER — Emergency Department (INDEPENDENT_AMBULATORY_CARE_PROVIDER_SITE_OTHER)
Admission: EM | Admit: 2013-12-11 | Discharge: 2013-12-11 | Disposition: A | Discharge status: Home / Self Care | Payer: BC Managed Care – PPO | Source: Home / Self Care | Attending: Family Medicine | Admitting: Family Medicine

## 2013-12-11 ENCOUNTER — Encounter (HOSPITAL_COMMUNITY): Payer: Self-pay | Admitting: Emergency Medicine

## 2013-12-11 DIAGNOSIS — M542 Cervicalgia: Secondary | ICD-10-CM

## 2013-12-11 MED ORDER — CYCLOBENZAPRINE HCL 5 MG PO TABS: 5.0000 mg | ORAL_TABLET | Freq: Every evening | ORAL | Status: DC | PRN

## 2013-12-11 MED ORDER — DICLOFENAC SODIUM 50 MG PO TBEC: 50.0000 mg | DELAYED_RELEASE_TABLET | Freq: Two times a day (BID) | ORAL | Status: DC | PRN
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
## 2013-12-11 NOTE — ED Provider Notes (Signed)
Medical screening examination/treatment/procedure(s) were performed by non-physician practitioner and as supervising physician I was immediately available for consultation/collaboration.     Kevin E Steinl, MD 12/11/13 0709 
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
## 2013-12-18 ENCOUNTER — Ambulatory Visit: Payer: BC Managed Care – PPO | Attending: Family Medicine | Admitting: Physical Therapy

## 2014-01-22 ENCOUNTER — Encounter (HOSPITAL_COMMUNITY): Payer: Self-pay | Admitting: *Deleted

## 2014-01-22 ENCOUNTER — Inpatient Hospital Stay (HOSPITAL_COMMUNITY)
Admission: AD | Admit: 2014-01-22 | Discharge: 2014-01-22 | Disposition: A | Discharge status: Home / Self Care | Payer: BC Managed Care – PPO | Source: Ambulatory Visit | Attending: Obstetrics & Gynecology | Admitting: Obstetrics & Gynecology

## 2014-01-22 ENCOUNTER — Inpatient Hospital Stay (HOSPITAL_COMMUNITY): Payer: BC Managed Care – PPO

## 2014-01-22 DIAGNOSIS — N925 Other specified irregular menstruation: Secondary | ICD-10-CM | POA: Insufficient documentation

## 2014-01-22 DIAGNOSIS — N949 Unspecified condition associated with female genital organs and menstrual cycle: Secondary | ICD-10-CM | POA: Diagnosis not present

## 2014-01-22 DIAGNOSIS — R1032 Left lower quadrant pain: Secondary | ICD-10-CM | POA: Diagnosis present

## 2014-01-22 DIAGNOSIS — R102 Pelvic and perineal pain: Secondary | ICD-10-CM

## 2014-01-22 DIAGNOSIS — N938 Other specified abnormal uterine and vaginal bleeding: Secondary | ICD-10-CM | POA: Diagnosis not present

## 2014-01-22 DIAGNOSIS — N939 Abnormal uterine and vaginal bleeding, unspecified: Secondary | ICD-10-CM

## 2014-01-22 HISTORY — DX: Other specified health status: Z78.9

## 2014-01-22 LAB — URINE MICROSCOPIC-ADD ON

## 2014-01-22 LAB — CBC
HCT: 37.1 % (ref 36.0–46.0)
Hemoglobin: 12.7 g/dL (ref 12.0–15.0)
MCH: 30.9 pg (ref 26.0–34.0)
MCHC: 34.2 g/dL (ref 30.0–36.0)
MCV: 90.3 fL (ref 78.0–100.0)
Platelets: 236 10*3/uL (ref 150–400)
RBC: 4.11 MIL/uL (ref 3.87–5.11)
RDW: 12.4 % (ref 11.5–15.5)
WBC: 4.4 10*3/uL (ref 4.0–10.5)

## 2014-01-22 LAB — URINALYSIS, ROUTINE W REFLEX MICROSCOPIC
BILIRUBIN URINE: NEGATIVE
Glucose, UA: NEGATIVE mg/dL
Ketones, ur: NEGATIVE mg/dL
Leukocytes, UA: NEGATIVE
Nitrite: NEGATIVE
PH: 8.5 — AB (ref 5.0–8.0)
Protein, ur: NEGATIVE mg/dL
SPECIFIC GRAVITY, URINE: 1.015 (ref 1.005–1.030)
UROBILINOGEN UA: 1 mg/dL (ref 0.0–1.0)

## 2014-01-22 LAB — WET PREP, GENITAL
CLUE CELLS WET PREP: NONE SEEN
TRICH WET PREP: NONE SEEN
Yeast Wet Prep HPF POC: NONE SEEN

## 2014-01-22 LAB — POCT PREGNANCY, URINE: Preg Test, Ur: NEGATIVE

## 2014-01-22 MED ORDER — NORGESTIMATE-ETH ESTRADIOL 0.25-35 MG-MCG PO TABS: 1.0000 | ORAL_TABLET | Freq: Every day | ORAL | Status: AC

## 2014-01-22 MED ORDER — IBUPROFEN 800 MG PO TABS: 800.0000 mg | ORAL_TABLET | Freq: Three times a day (TID) | ORAL | Status: DC

## 2014-01-22 MED ORDER — KETOROLAC TROMETHAMINE 60 MG/2ML IM SOLN
60.0000 mg | Freq: Once | INTRAMUSCULAR | Status: AC
  Administered 2014-01-22: 60 mg via INTRAMUSCULAR
  Filled 2014-01-22: qty 2

## 2014-01-22 NOTE — Progress Notes (Signed)
Moderate amt of vaginal; bleeding noted

## 2014-01-22 NOTE — MAU Note (Signed)
Pain in left ovary, and in low back on left side. Off and on for the past month.  Last period was normal,  Was off a few days and then started again.

## 2014-01-22 NOTE — MAU Note (Signed)
Pt states she is having left ovary pain . Pt states she has had this pain on and off for about 1 month. Pt states pain gets worse when she has her period.pt states she has been on her period for a week and a half. It stopped for 2 days and then it came back on again.Pt states she is also having back pain

## 2014-01-22 NOTE — MAU Note (Signed)
Denies hx of ovarian cysts

## 2014-01-22 NOTE — MAU Provider Note (Signed)
History     CSN: [id number]  Arrival date and time: 01/22/14 1724   First Provider Initiated Contact with Patient 01/22/14 1851      Chief Complaint  Patient presents with  . Abdominal Pain  . Back Pain  . Vaginal Bleeding   HPI Pt is not pregnant G0P0 who presents for left lower abdominal pain. Pt had a period on8/07/2013 bled for 6 days off for 2 days and then started again 3 days ago. Pt usually has pain with period.  Pt always has pain the first day of her period. Pt says ibuprofen does not help the pain.  Pt has not been sexually active in 3 years.  Pt says she has not a bowel movement in 3 days.  Pt denies constipation or diarrhea.  Pt has had some nausea .  Pt threw up 2 times yesterday.  Pt usually has Nausea and vomiting with her her periods.  Pt has hx of kidney stones; this pain is not like kidney stones.  Pt denies abnormal vaginal discharge.  Pt has had chills, no fever  Past Medical History  Diagnosis Date  . Medical history non-contributory     Past Surgical History  Procedure Laterality Date  . No past surgeries      Family History  Problem Relation Age of Onset  . Hypertension Mother   . Hypertension Maternal Aunt   . Cancer Maternal Aunt   . Cancer Maternal Grandfather     History  Substance Use Topics  . Smoking status: Never Smoker   . Smokeless tobacco: Not on file  . Alcohol Use: No    Allergies:  Allergies  Allergen Reactions  . Penicillins Hives and Itching    Prescriptions prior to admission  Medication Sig Dispense Refill  . acetaminophen (TYLENOL) 500 MG tablet Take 500 mg by mouth every 6 (six) hours as needed for moderate pain.      . cyclobenzaprine (FLEXERIL) 5 MG tablet Take 1 tablet (5 mg total) by mouth at bedtime as needed for muscle spasms.  20 tablet  0  . diclofenac (VOLTAREN) 50 MG EC tablet Take 1 tablet (50 mg total) by mouth 2 (two) times daily as needed.  60 tablet  0    Review of Systems  Constitutional: Negative for  fever and chills.  Gastrointestinal: Positive for nausea and abdominal pain. Negative for vomiting, diarrhea and constipation.  Genitourinary: Negative for dysuria and urgency.  Neurological: Positive for dizziness and headaches.   Physical Exam   Blood pressure 125/80, pulse 80, temperature 98.6 F (37 C), temperature source Oral, resp. rate 16, weight 114 lb (51.71 kg), last menstrual period 01/20/2014.  Physical Exam  Nursing note and vitals reviewed. Constitutional: She is oriented to person, place, and time. She appears well-developed and well-nourished. No distress.  HENT:  Head: Normocephalic.  Neck: Normal range of motion. Neck supple.  Cardiovascular: Normal rate.   Respiratory: Effort normal.  GI: Soft. There is tenderness.  Genitourinary:  sm-mod amount of dark red blood in vault; cervix, clean, nullip, NT; left adnexa tender without rebound; right adnexa without palpable enlargement or tenderness  Musculoskeletal: Normal range of motion.  Neurological: She is alert and oriented to person, place, and time.  Skin: Skin is warm and dry.  Psychiatric: She has a normal mood and affect.    MAU Course  Procedures Results for orders placed during the hospital encounter of 01/22/14 (from the past 24 hour(s))  URINALYSIS, ROUTINE W REFLEX MICROSCOPIC  Status: Abnormal   Collection Time    01/22/14  5:45 PM      Result Value Ref Range   Color, Urine RED (*) YELLOW   APPearance HAZY (*) CLEAR   Specific Gravity, Urine 1.015  1.005 - 1.030   pH 8.5 (*) 5.0 - 8.0   Glucose, UA NEGATIVE  NEGATIVE mg/dL   Hgb urine dipstick LARGE (*) NEGATIVE   Bilirubin Urine NEGATIVE  NEGATIVE   Ketones, ur NEGATIVE  NEGATIVE mg/dL   Protein, ur NEGATIVE  NEGATIVE mg/dL   Urobilinogen, UA 1.0  0.0 - 1.0 mg/dL   Nitrite NEGATIVE  NEGATIVE   Leukocytes, UA NEGATIVE  NEGATIVE  URINE MICROSCOPIC-ADD ON     Status: None   Collection Time    01/22/14  5:45 PM      Result Value Ref Range    Squamous Epithelial / LPF RARE  RARE   WBC, UA 0-2  <3 WBC/hpf   RBC / HPF 21-50  <3 RBC/hpf   Bacteria, UA RARE  RARE  POCT PREGNANCY, URINE     Status: None   Collection Time    01/22/14  6:33 PM      Result Value Ref Range   Preg Test, Ur NEGATIVE  NEGATIVE  CBC     Status: None   Collection Time    01/22/14  7:15 PM      Result Value Ref Range   WBC 4.4  4.0 - 10.5 K/uL   RBC 4.11  3.87 - 5.11 MIL/uL   Hemoglobin 12.7  12.0 - 15.0 g/dL   HCT 16.137.1  09.636.0 - 04.546.0 %   MCV 90.3  78.0 - 100.0 fL   MCH 30.9  26.0 - 34.0 pg   MCHC 34.2  30.0 - 36.0 g/dL   RDW 40.912.4  81.111.5 - 91.415.5 %   Platelets 236  150 - 400 K/uL  WET PREP, GENITAL     Status: Abnormal   Collection Time    01/22/14  7:35 PM      Result Value Ref Range   Yeast Wet Prep HPF POC NONE SEEN  NONE SEEN   Trich, Wet Prep NONE SEEN  NONE SEEN   Clue Cells Wet Prep HPF POC NONE SEEN  NONE SEEN   WBC, Wet Prep HPF POC RARE (*) NONE SEEN  [place] Transvaginal Non-ob  01/22/2014   CLINICAL DATA:  LEFT adnexal pain.  EXAM: TRANSABDOMINAL AND TRANSVAGINAL ULTRASOUND OF PELVIS  TECHNIQUE: Both transabdominal and transvaginal ultrasound examinations of the pelvis were performed. Transabdominal technique was performed for global imaging of the pelvis including uterus, ovaries, adnexal regions, and pelvic cul-de-sac. It was necessary to proceed with endovaginal exam following the transabdominal exam to visualize the uterus, endometrium, and adnexal structures.  COMPARISON:  None  FINDINGS: Uterus  Measurements: 64 mm x 35 mm x 46 mm. No fibroids or other mass visualized.  Endometrium  Thickness: 1 mm.  No focal abnormality visualized.  Right ovary  Measurements: 37 mm x 20 mm x 24 mm. Normal appearance/no adnexal mass.  Left ovary  Measurements: 41 mm x 21 mm x 26 mm. Normal appearance/no adnexal mass.  Other findings  Small amount of free fluid in the anatomic pelvis.  IMPRESSION: Normal pelvic ultrasound.   Electronically Signed   By: Andreas NewportGeoffrey   Lamke M.D.   On: 01/22/2014 21:20   [place] Pelvis Complete  01/22/2014   CLINICAL DATA:  LEFT adnexal pain.  EXAM: TRANSABDOMINAL AND TRANSVAGINAL ULTRASOUND OF  PELVIS  TECHNIQUE: Both transabdominal and transvaginal ultrasound examinations of the pelvis were performed. Transabdominal technique was performed for global imaging of the pelvis including uterus, ovaries, adnexal regions, and pelvic cul-de-sac. It was necessary to proceed with endovaginal exam following the transabdominal exam to visualize the uterus, endometrium, and adnexal structures.  COMPARISON:  None  FINDINGS: Uterus  Measurements: 64 mm x 35 mm x 46 mm. No fibroids or other mass visualized.  Endometrium  Thickness: 1 mm.  No focal abnormality visualized.  Right ovary  Measurements: 37 mm x 20 mm x 24 mm. Normal appearance/no adnexal mass.  Left ovary  Measurements: 41 mm x 21 mm x 26 mm. Normal appearance/no adnexal mass.  Other findings  Small amount of free fluid in the anatomic pelvis.  IMPRESSION: Normal pelvic ultrasound.   Electronically Signed   By: Andreas Newport M.D.   On: 01/22/2014 21:20   Pelvic ultrasound Assessment and Plan  Abnormal uterine bleeding- Sprintec OCs start today Pelvic pain Ibuprofen 800mg  Rx F/u at [facility] 01/22/2014, 6:51 PM

## 2014-01-23 LAB — GC/CHLAMYDIA PROBE AMP
CT Probe RNA: NEGATIVE
GC Probe RNA: NEGATIVE

## 2014-01-23 NOTE — MAU Provider Note (Signed)
Attestation of Attending Supervision of Advanced Practitioner (CNM/NP): Evaluation and management procedures were performed by the Advanced Practitioner under my supervision and collaboration. I have reviewed the Advanced Practitioner's note and chart, and I agree with the management and plan.  Sherion Dooly H. 6:22 AM   

## 2014-07-07 ENCOUNTER — Encounter: Payer: Self-pay | Admitting: Family Medicine

## 2014-08-13 ENCOUNTER — Encounter: Payer: Self-pay | Admitting: Family Medicine

## 2014-08-16 IMAGING — CT CT ABD-PELV W/O CM
2 of 4 series · 17 of 46 positions shown, 19 images · non-contrast
Comparison: None.

CLINICAL DATA: Right flank pain.  Nausea and vomiting.

CT ABDOMEN AND PELVIS WITHOUT CONTRAST
TECHNIQUE: Multidetector CT imaging of the abdomen and pelvis was
performed following the standard protocol without intravenous
contrast.

[Series 2: stone 160 5.0 b31f st · axial · 0.52mm/px · z∈[-388,-18]mm · 14 of 82 slices shown, 16 images]
[im 4/82  soft-tissue]
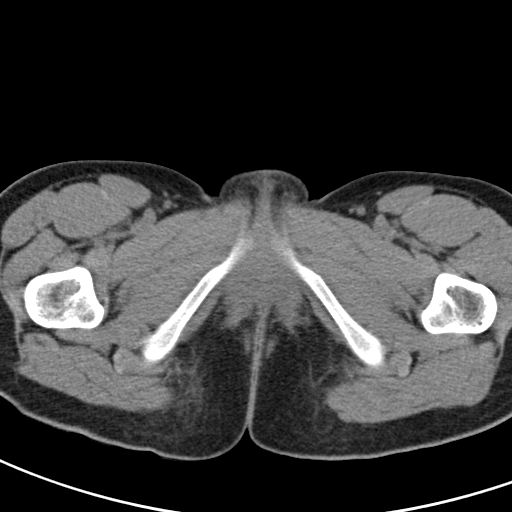
[im 4/82  bone]
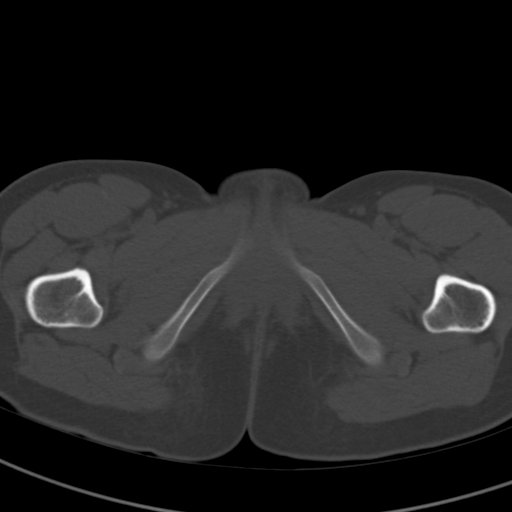
[im 10/82  soft-tissue]
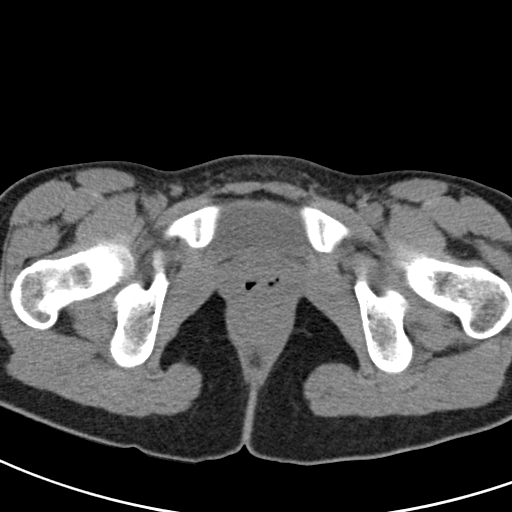
[im 17/82  soft-tissue]
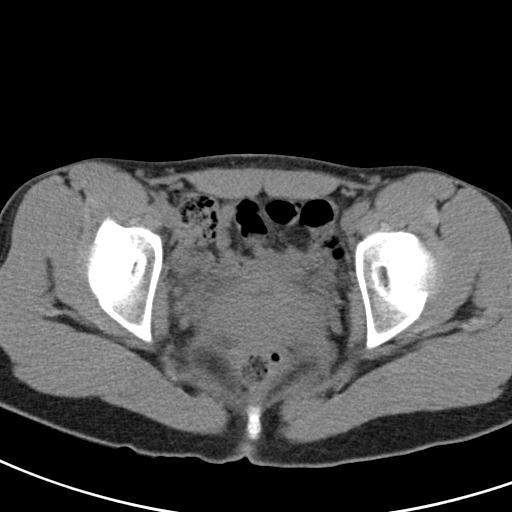
[im 23/82  soft-tissue]
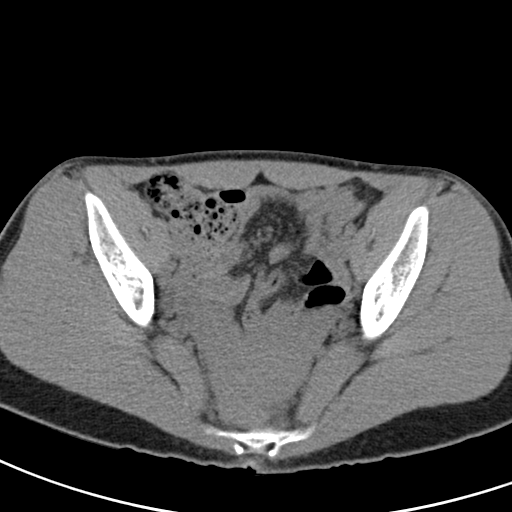
[im 26/82  soft-tissue]
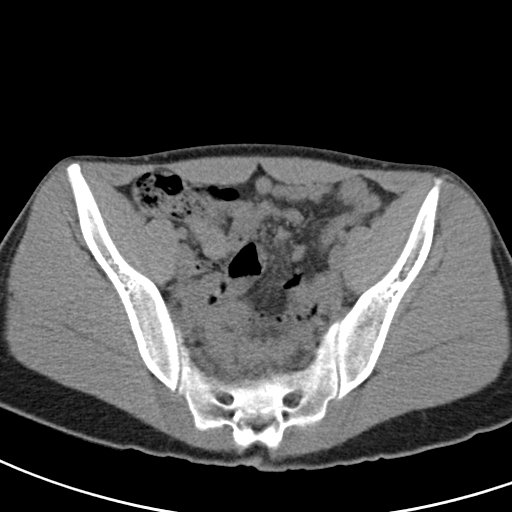
[im 33/82  soft-tissue]
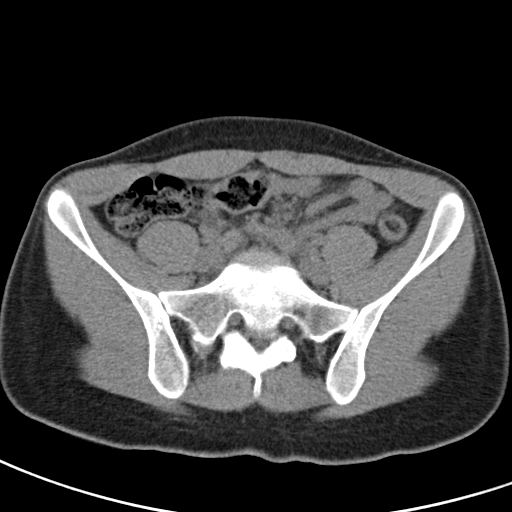
[im 39/82  soft-tissue]
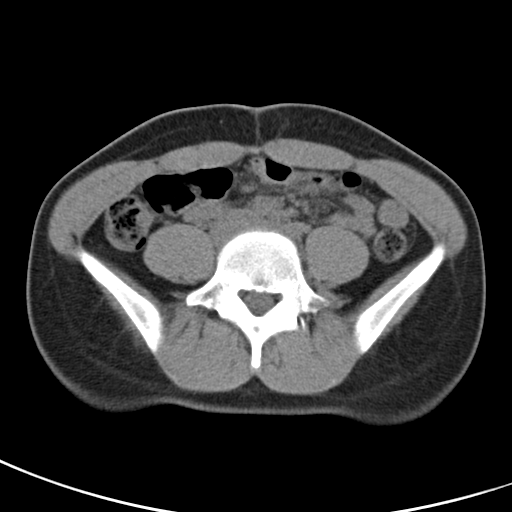
[im 43/82  soft-tissue]
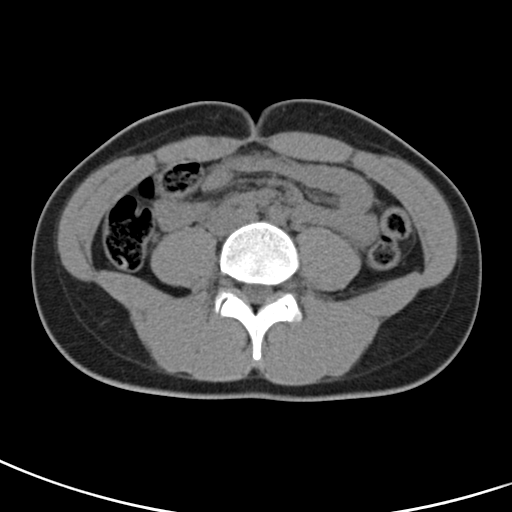
[im 49/82  soft-tissue]
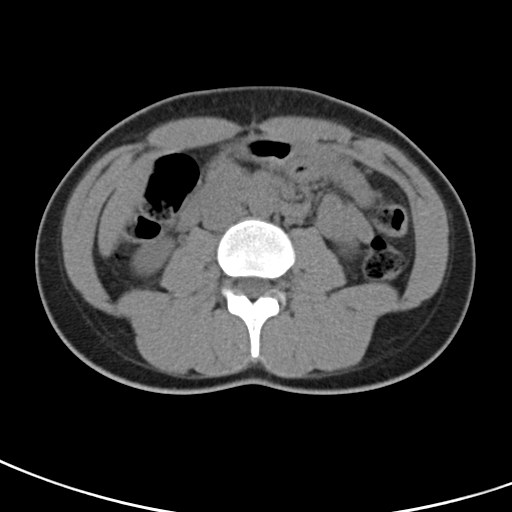
[im 49/82  bone]
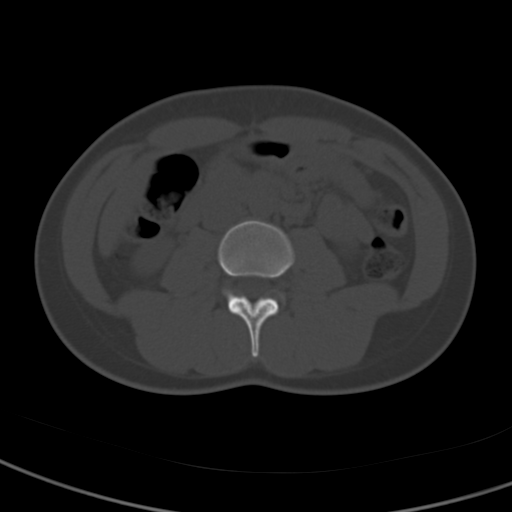
[im 56/82  soft-tissue]
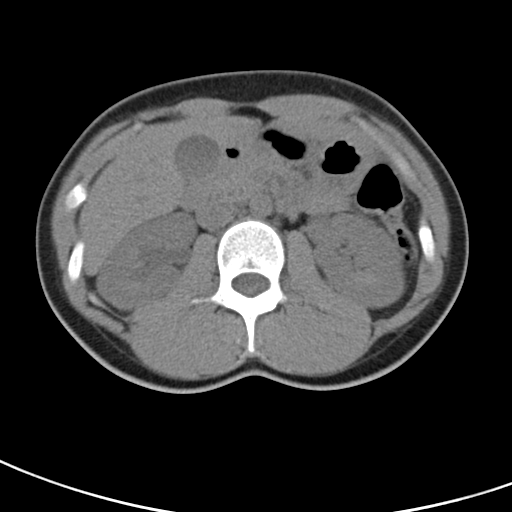
[im 62/82  soft-tissue]
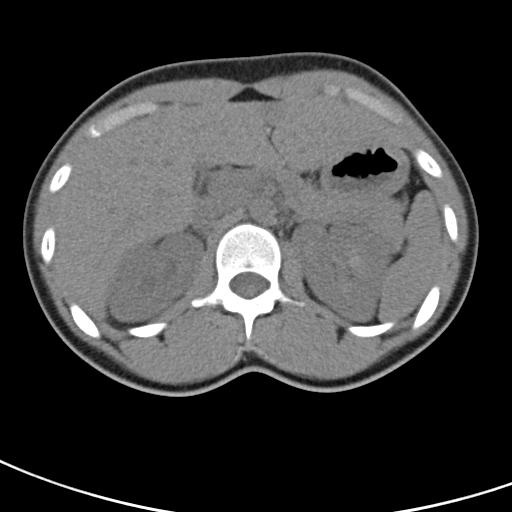
[im 65/82  soft-tissue]
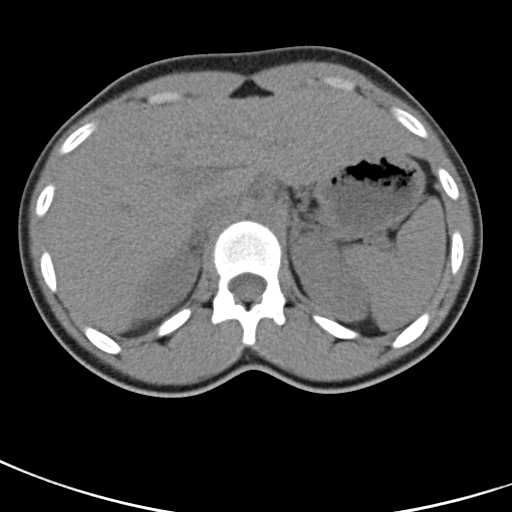
[im 72/82  soft-tissue]
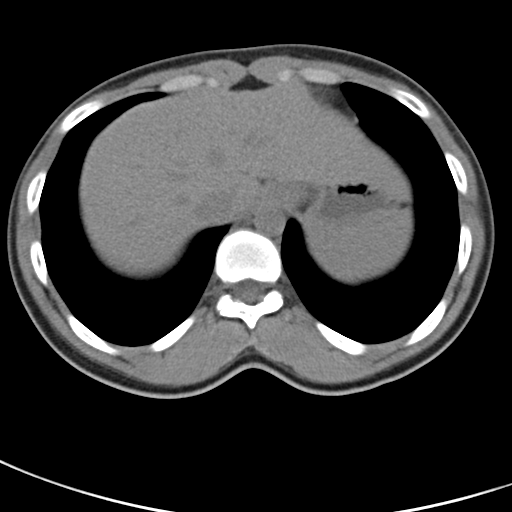
[im 78/82  soft-tissue]
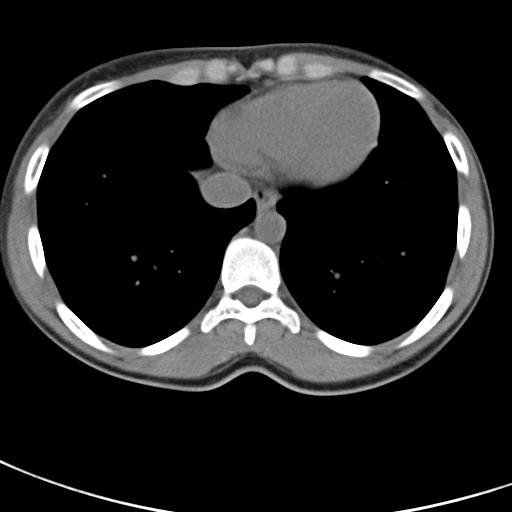

[Series 5: coronals · coronal · 0.79mm/px · 3 of 77 slices shown]
[im 26/77  soft-tissue]
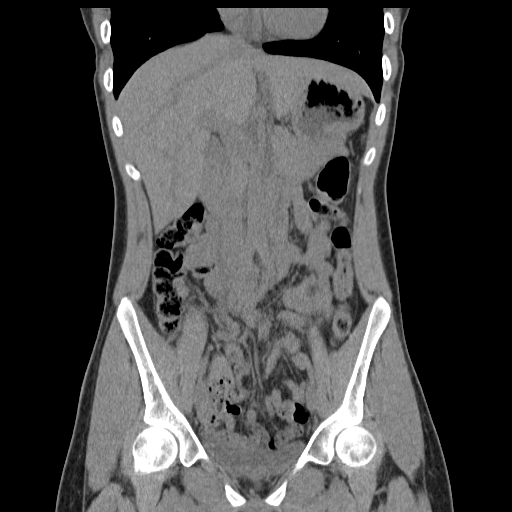
[im 34/77  soft-tissue]
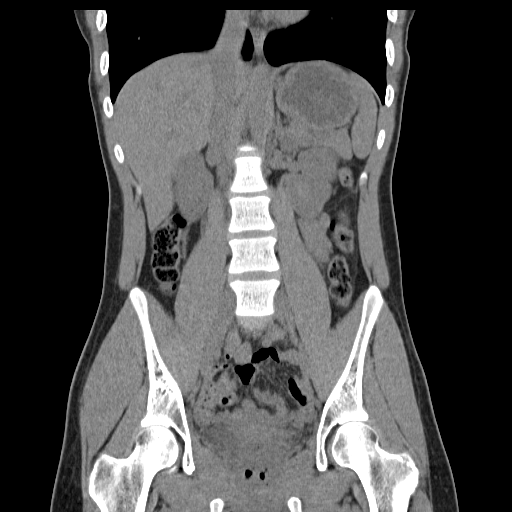
[im 43/77  soft-tissue]
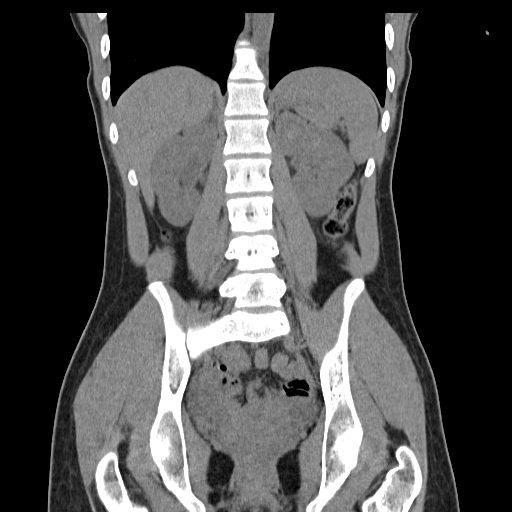

[17 of 46 positions shown; findings below may reference images not displayed]

FINDINGS: Approximate 4 mm calculus at the right ureterovesical
junction causing mild right hydronephrosis, moderate right renal
edema, and minimal right perinephric edema.  No urinary tract
calculi elsewhere on the right.  Tiny (1-2 mm) calculus in a lower
pole calix of the left kidney.  Hyperdense renal tubules.  Within
the limits of the unenhanced technique, no focal parenchymal
abnormality involving either kidney.

Normal unenhanced appearance of the liver with an anatomic variant
in that the left lobe extends well across the midline into the left
upper quadrant.  Normal-appearing spleen, pancreas, adrenal glands,
and gallbladder.  No biliary ductal dilation.  No visible aorto-
iliofemoral atherosclerosis.  No significant lymphadenopathy.

Normal-appearing stomach, small bowel, and colon.  Normal appendix
in the right mid pelvis.  No ascites.

Uterus and ovaries unremarkable for age.  Urinary bladder
decompressed and unremarkable.  No free pelvic fluid.

Bone window images unremarkable.  Visualized lung bases clear.
IMPRESSION: 1.  Obstructing approximate 4 mm calculus at the right UVJ.
2.  Tiny (1-2 mm) non-obstructing left lower pole renal calculus.
3.  Hyperdense renal tubules consistent with renal tubular ectasia.

## 2016-03-23 IMAGING — US US PELVIS COMPLETE
1 series · 14 of 25 positions shown · non-contrast
Comparison: None

CLINICAL DATA: LEFT adnexal pain.



[Series 1: us pelvis complete · 14 of 57 slices shown]
[im 1/57]
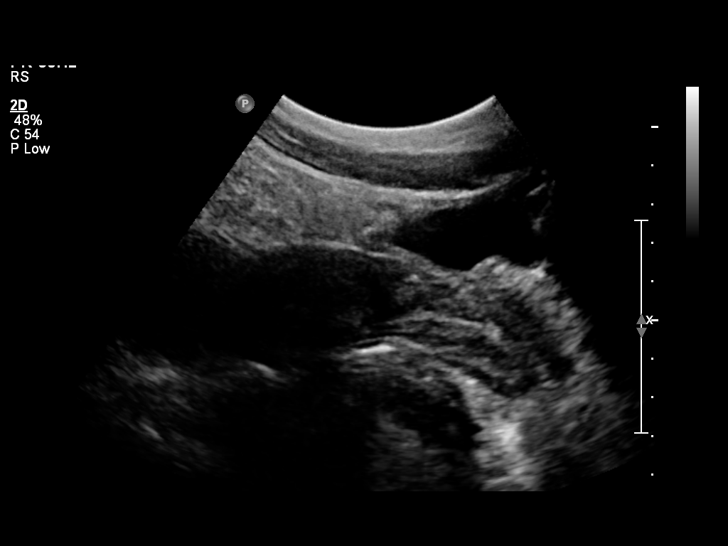
[im 5/57]
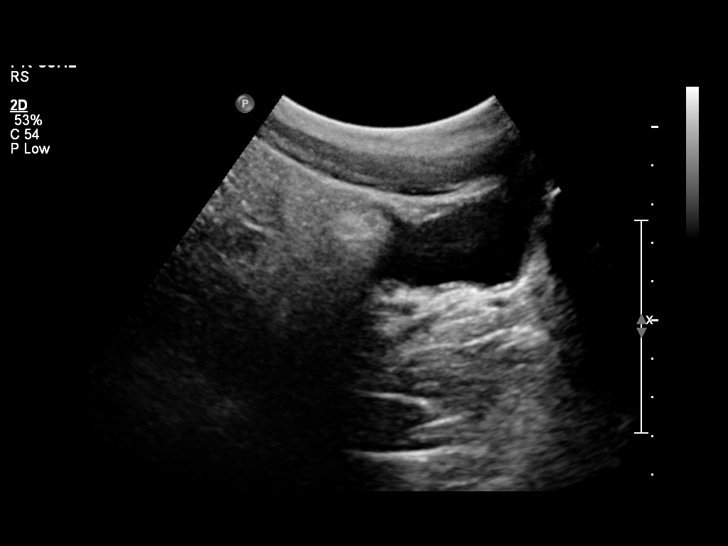
[im 10/57]
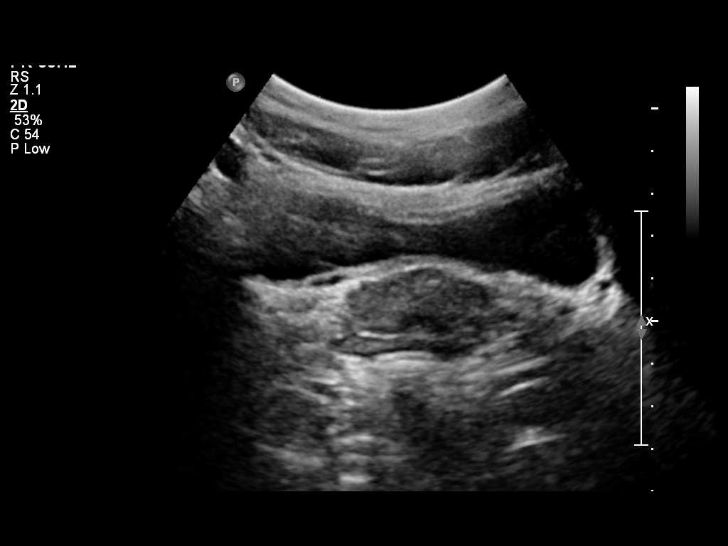
[im 15/57]
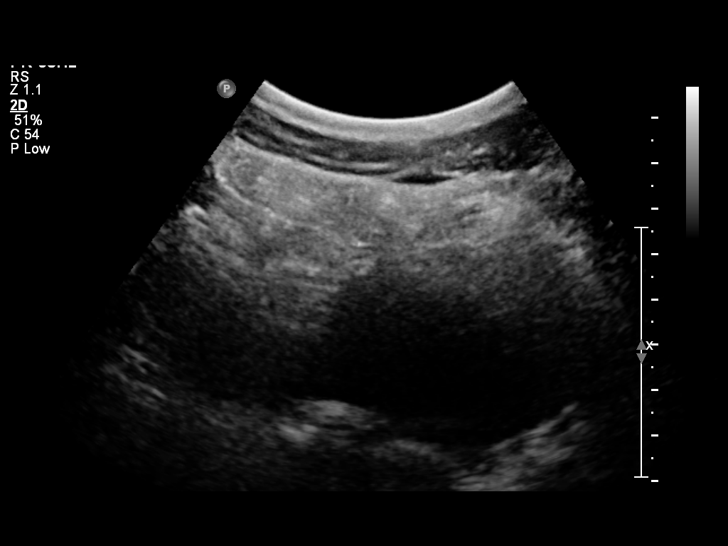
[im 19/57]
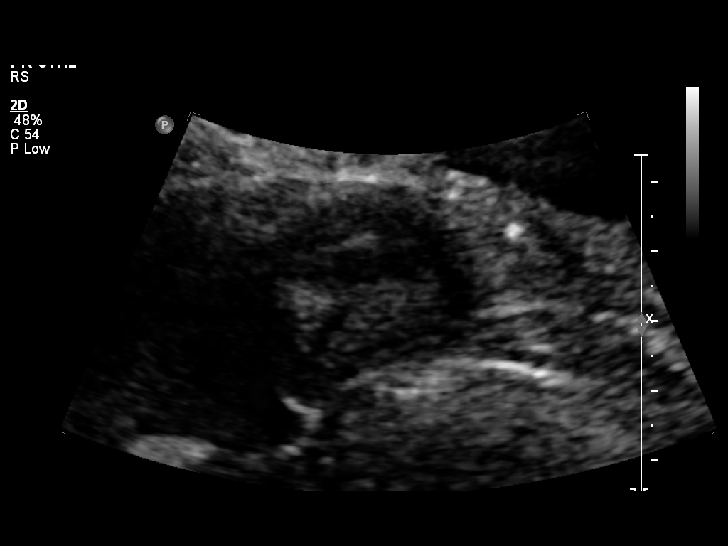
[im 22/57]
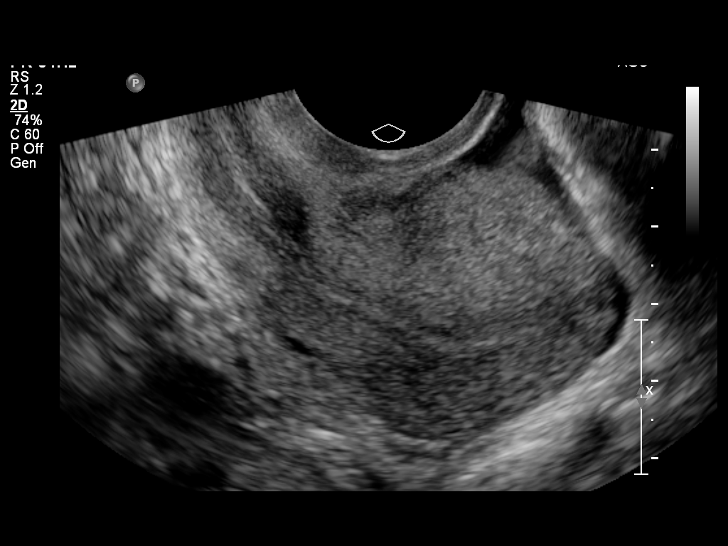
[im 26/57]
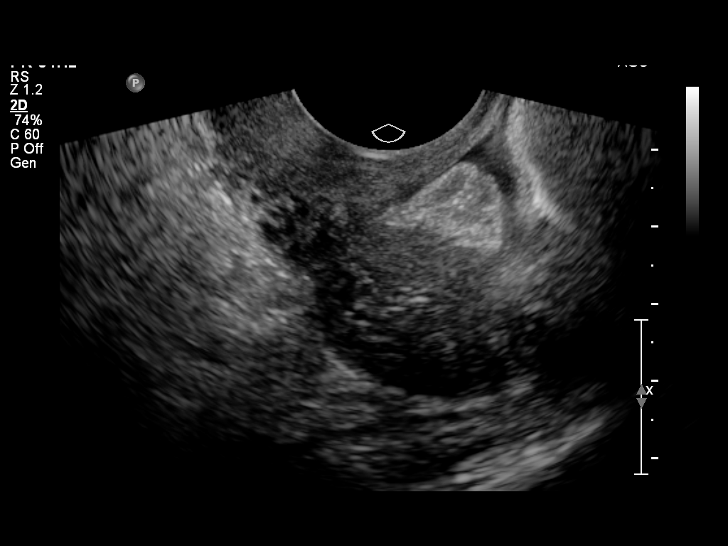
[im 31/57]
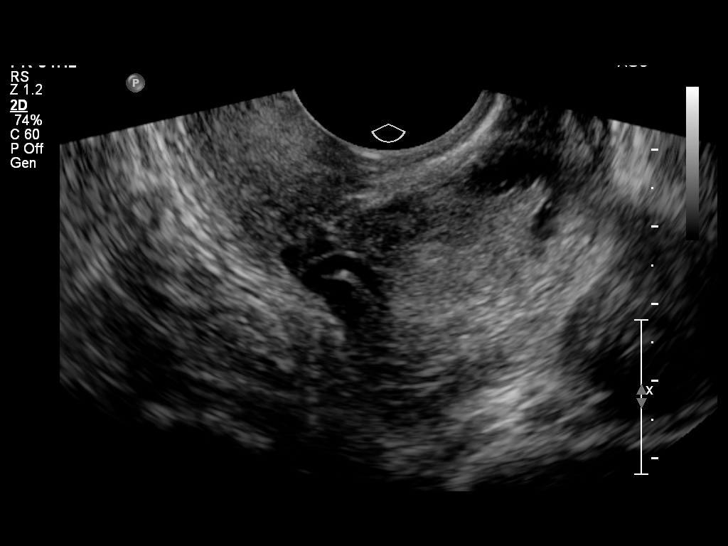
[im 36/57]
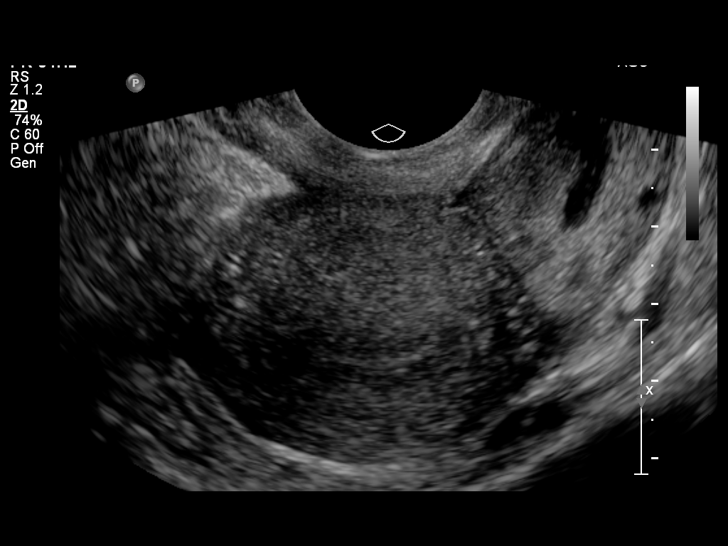
[im 38/57]
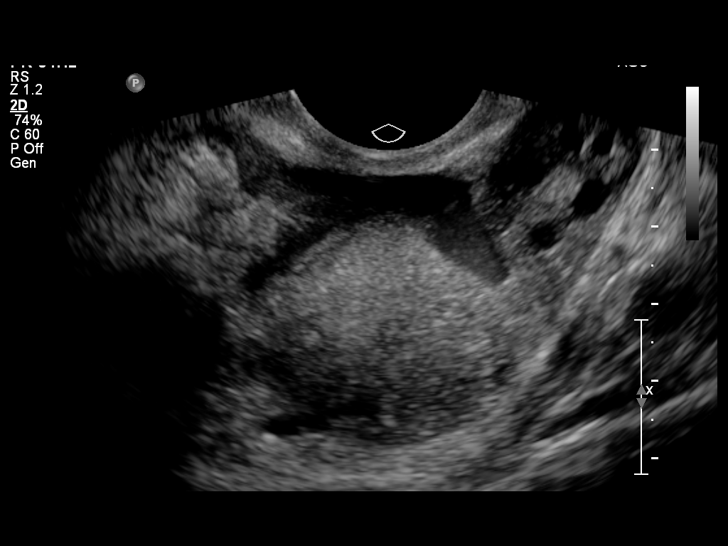
[im 43/57]
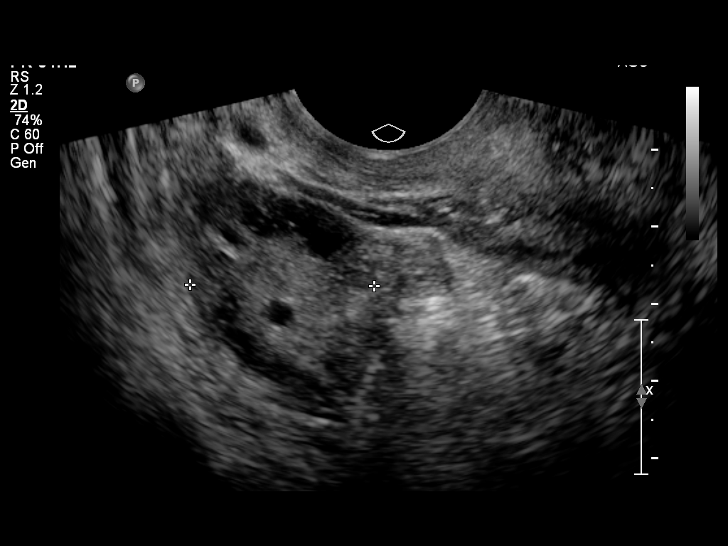
[im 47/57]
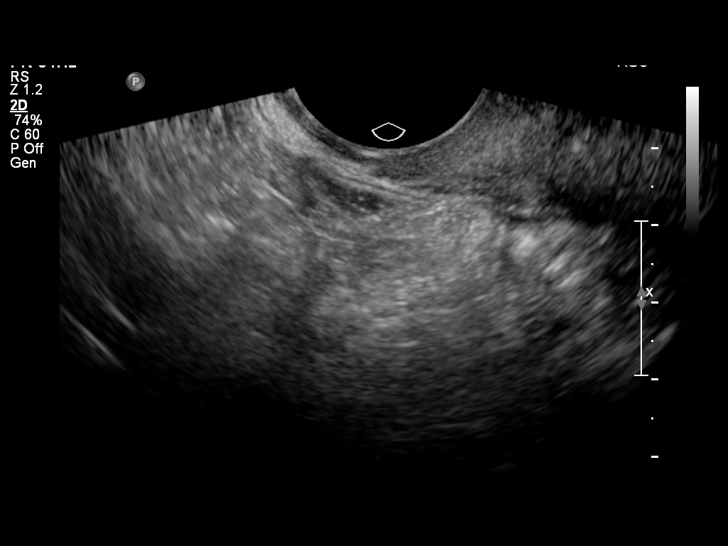
[im 52/57]
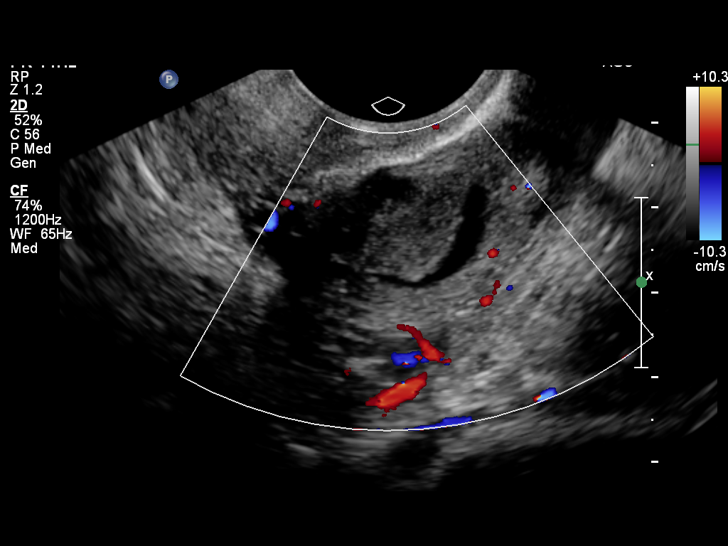
[im 57/57]
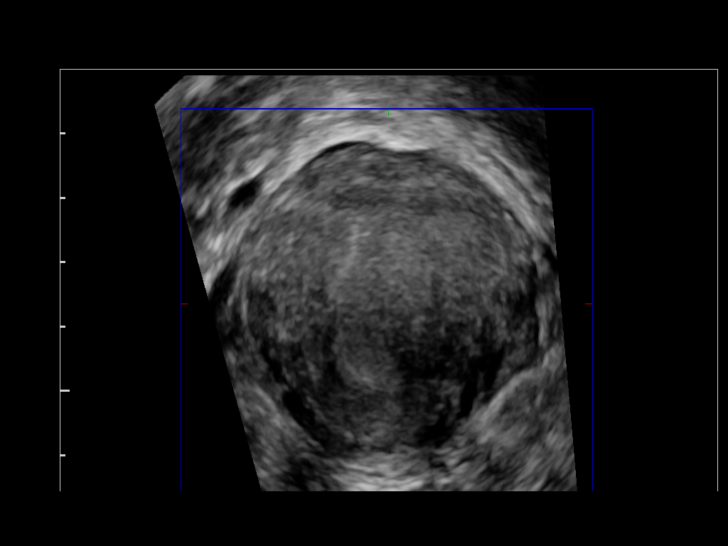

[14 of 25 positions shown; findings below may reference images not displayed]

FINDINGS: Uterus

Measurements: 64 mm x 35 mm x 46 mm. No fibroids or other mass
visualized.

Endometrium

Thickness: 1 mm.  No focal abnormality visualized.

Right ovary

Measurements: 37 mm x 20 mm x 24 mm. Normal appearance/no adnexal
mass.

Left ovary

Measurements: 41 mm x 21 mm x 26 mm. Normal appearance/no adnexal
mass.

Other findings

Small amount of free fluid in the anatomic pelvis.
IMPRESSION: Normal pelvic ultrasound.

## 2017-11-09 ENCOUNTER — Emergency Department (HOSPITAL_COMMUNITY)
Admission: EM | Admit: 2017-11-09 | Discharge: 2017-11-09 | Disposition: A | Discharge status: Home / Self Care | Payer: BLUE CROSS/BLUE SHIELD | Attending: Emergency Medicine | Admitting: Emergency Medicine

## 2017-11-09 ENCOUNTER — Encounter (HOSPITAL_COMMUNITY): Payer: Self-pay

## 2017-11-09 ENCOUNTER — Other Ambulatory Visit: Payer: Self-pay

## 2017-11-09 DIAGNOSIS — R197 Diarrhea, unspecified: Secondary | ICD-10-CM | POA: Insufficient documentation

## 2017-11-09 DIAGNOSIS — Z79899 Other long term (current) drug therapy: Secondary | ICD-10-CM | POA: Insufficient documentation

## 2017-11-09 DIAGNOSIS — K529 Noninfective gastroenteritis and colitis, unspecified: Secondary | ICD-10-CM | POA: Diagnosis not present

## 2017-11-09 DIAGNOSIS — R102 Pelvic and perineal pain: Secondary | ICD-10-CM | POA: Diagnosis not present

## 2017-11-09 DIAGNOSIS — R1012 Left upper quadrant pain: Secondary | ICD-10-CM | POA: Diagnosis present

## 2017-11-09 DIAGNOSIS — R112 Nausea with vomiting, unspecified: Secondary | ICD-10-CM

## 2017-11-09 LAB — WET PREP, GENITAL
Clue Cells Wet Prep HPF POC: NONE SEEN
Sperm: NONE SEEN
Trich, Wet Prep: NONE SEEN
Yeast Wet Prep HPF POC: NONE SEEN

## 2017-11-09 LAB — URINALYSIS, ROUTINE W REFLEX MICROSCOPIC
BILIRUBIN URINE: NEGATIVE
Bacteria, UA: NONE SEEN
Glucose, UA: NEGATIVE mg/dL
KETONES UR: 80 mg/dL — AB
LEUKOCYTES UA: NEGATIVE
Nitrite: NEGATIVE
Protein, ur: 30 mg/dL — AB
Specific Gravity, Urine: 1.027 (ref 1.005–1.030)
pH: 5 (ref 5.0–8.0)

## 2017-11-09 LAB — CBC
HCT: 41.9 % (ref 36.0–46.0)
HEMOGLOBIN: 14.7 g/dL (ref 12.0–15.0)
MCH: 31.3 pg (ref 26.0–34.0)
MCHC: 35.1 g/dL (ref 30.0–36.0)
MCV: 89.1 fL (ref 78.0–100.0)
Platelets: 259 10*3/uL (ref 150–400)
RBC: 4.7 MIL/uL (ref 3.87–5.11)
RDW: 11.5 % (ref 11.5–15.5)
WBC: 11.3 10*3/uL — ABNORMAL HIGH (ref 4.0–10.5)

## 2017-11-09 LAB — COMPREHENSIVE METABOLIC PANEL
ALT: 18 U/L (ref 14–54)
ANION GAP: 9 (ref 5–15)
AST: 27 U/L (ref 15–41)
Albumin: 4.9 g/dL (ref 3.5–5.0)
Alkaline Phosphatase: 59 U/L (ref 38–126)
BILIRUBIN TOTAL: 0.7 mg/dL (ref 0.3–1.2)
BUN: 13 mg/dL (ref 6–20)
CO2: 25 mmol/L (ref 22–32)
Calcium: 9.5 mg/dL (ref 8.9–10.3)
Chloride: 105 mmol/L (ref 101–111)
Creatinine, Ser: 0.76 mg/dL (ref 0.44–1.00)
GFR calc Af Amer: >60 mL/min (ref >60)
GFR calc non Af Amer: >60 mL/min (ref >60)
Glucose, Bld: 104 mg/dL — ABNORMAL HIGH (ref 65–99)
POTASSIUM: 3.4 mmol/L — AB (ref 3.5–5.1)
SODIUM: 139 mmol/L (ref 135–145)
TOTAL PROTEIN: 9.2 g/dL — AB (ref 6.5–8.1)

## 2017-11-09 LAB — I-STAT BETA HCG BLOOD, ED (MC, WL, AP ONLY)

## 2017-11-09 LAB — LIPASE, BLOOD: Lipase: 32 U/L (ref 11–51)

## 2017-11-09 MED ORDER — MORPHINE SULFATE (PF) 4 MG/ML IV SOLN
4.0000 mg | Freq: Once | INTRAVENOUS | Status: AC
  Administered 2017-11-09: 4 mg via INTRAVENOUS
  Filled 2017-11-09: qty 1

## 2017-11-09 MED ORDER — FAMOTIDINE 20 MG PO TABS: 20.0000 mg | ORAL_TABLET | Freq: Two times a day (BID) | ORAL | 0 refills | Status: DC

## 2017-11-09 MED ORDER — ONDANSETRON HCL 4 MG PO TABS: 4.0000 mg | ORAL_TABLET | Freq: Four times a day (QID) | ORAL | 0 refills | Status: DC

## 2017-11-09 MED ORDER — SODIUM CHLORIDE 0.9 % IV BOLUS
1000.0000 mL | Freq: Once | INTRAVENOUS | Status: AC
  Administered 2017-11-09: 1000 mL via INTRAVENOUS

## 2017-11-09 MED ORDER — ONDANSETRON HCL 4 MG/2ML IJ SOLN
4.0000 mg | Freq: Once | INTRAMUSCULAR | Status: AC
  Administered 2017-11-09: 4 mg via INTRAVENOUS
  Filled 2017-11-09: qty 2

## 2017-11-09 NOTE — Discharge Instructions (Signed)
Medications: Pepcid, Zofran  Treatment: Take Pepcid twice daily as needed for irritation in your stomach.  Take Zofran every 6 hours as needed for nausea or vomiting.  Start with clear liquids and bland diet including bananas, rice, applesauce, toast.  You can then progress to take chicken.  Avoid greasy, fatty, spicy foods until you are feeling better.  Make sure to drink plenty of fluids to stay hydrated.  You will be called if you test positive for gonorrhea or chlamydia and you will be advised to see your doctor or the health department for treatment.  Follow-up: Please follow-up with your doctor next week for recheck if your symptoms are not improving.  Please return to the emergency department if you develop any new or worsening symptoms including fever over 100.4, intractable vomiting, localizing abdominal pain, or any other new or concerning symptoms.

## 2017-11-09 NOTE — ED Triage Notes (Signed)
Patient c/o abdominal pain, vomiting, and diarrhea since 0300 today.

## 2017-11-09 NOTE — ED Provider Notes (Signed)
[facility]-EMERGENCY DEPT Provider Note   CSN: [id number] Arrival date & time: 11/09/17  1034     History   Chief Complaint Chief Complaint  Patient presents with  . Abdominal Pain  . Emesis  . Diarrhea    HPI Krista Barnes is a 27 y.o. female who is previously healthy who presents with acute onset left upper and lower abdominal pain, nausea, vomiting and some diarrhea.  Patient states she normally has some diarrhea when she is on her menstrual cycle, which she is currently.  She denies eating anything abnormal last night.  Her symptoms started around 3 AM.  She has not been able to keep anything down including fluids or medicine.  She denies any abnormal vaginal discharge, concern for STD exposure.  She denies any urinary symptoms, chest pain.  She denies any hematemesis or hematochezia.  HPI  Past Medical History:  Diagnosis Date  . Medical history non-contributory     There are no active problems to display for this patient.   Past Surgical History:  Procedure Laterality Date  . NO PAST SURGERIES       OB History    Gravida  0   Para      Term      Preterm      AB      Living        SAB      TAB      Ectopic      Multiple      Live Births               Home Medications    Prior to Admission medications   Medication Sig Start Date End Date Taking? Authorizing Provider  acetaminophen (TYLENOL) 500 MG tablet Take 500 mg by mouth every 6 (six) hours as needed for moderate pain.   Yes [provider]  norgestimate-ethinyl estradiol (ORTHO-CYCLEN,SPRINTEC,PREVIFEM) 0.25-35 MG-MCG tablet Take 1 tablet by mouth daily. 01/22/14  Yes Jean Rosenthal, NP  cyclobenzaprine (FLEXERIL) 5 MG tablet Take 1 tablet (5 mg total) by mouth at bedtime as needed for muscle spasms. Patient not taking: Reported on 11/09/2017 12/11/13   Rodolph Bong, MD  diclofenac (VOLTAREN) 50 MG EC tablet Take 1 tablet (50 mg total) by mouth 2  (two) times daily as needed. Patient not taking: Reported on 11/09/2017 12/11/13   Rodolph Bong, MD  famotidine (PEPCID) 20 MG tablet Take 1 tablet (20 mg total) by mouth 2 (two) times daily. 11/09/17   Vinal Rosengrant, Waylan Boga, PA-C  ibuprofen (ADVIL,MOTRIN) 800 MG tablet Take 1 tablet (800 mg total) by mouth 3 (three) times daily. Patient not taking: Reported on 11/09/2017 01/22/14   Jean Rosenthal, NP  ondansetron (ZOFRAN) 4 MG tablet Take 1 tablet (4 mg total) by mouth every 6 (six) hours. 11/09/17   Emi Holes, PA-C    Family History Family History  Problem Relation Age of Onset  . Hypertension Mother   . Hypertension Maternal Aunt   . Cancer Maternal Aunt   . Cancer Maternal Grandfather     Social History Social History   Tobacco Use  . Smoking status: Never Smoker  . Smokeless tobacco: Never Used  Substance Use Topics  . Alcohol use: No  . Drug use: No     Allergies   Penicillins   Review of Systems Review of Systems  Constitutional: Negative for chills and fever.  HENT: Negative for facial swelling and sore throat.  Respiratory: Negative for shortness of breath.   Cardiovascular: Negative for chest pain.  Gastrointestinal: Positive for abdominal pain, diarrhea, nausea and vomiting. Negative for blood in stool.  Genitourinary: Positive for vaginal bleeding. Negative for dysuria, flank pain and vaginal discharge.  Musculoskeletal: Negative for back pain.  Skin: Negative for rash and wound.  Neurological: Negative for headaches.  Psychiatric/Behavioral: The patient is not nervous/anxious.      Physical Exam Updated Vital Signs BP 113/72   Pulse 88   Temp 97.6 F (36.4 C) (Oral)   Resp 16   Ht  (1.626 m)   Wt 45.4 kg (100 lb)   LMP 11/09/2017   SpO2 100%   BMI 17.16 kg/m   Physical Exam  Constitutional: She appears well-developed and well-nourished. No distress.  HENT:  Head: Normocephalic and atraumatic.  Mouth/Throat: Oropharynx is clear and  moist. No oropharyngeal exudate.  Eyes: Pupils are equal, round, and reactive to light. Conjunctivae are normal. Right eye exhibits no discharge. Left eye exhibits no discharge. No scleral icterus.  Neck: Normal range of motion. Neck supple. No thyromegaly present.  Cardiovascular: Normal rate, regular rhythm, normal heart sounds and intact distal pulses. Exam reveals no gallop and no friction rub.  No murmur heard. Pulmonary/Chest: Effort normal and breath sounds normal. No stridor. No respiratory distress. She has no wheezes. She has no rales.  Abdominal: Soft. Bowel sounds are normal. She exhibits no distension. There is tenderness in the left upper quadrant and left lower quadrant. There is no rigidity, no rebound, no guarding, no CVA tenderness, no tenderness at McBurney's point and negative Murphy's sign.  Musculoskeletal: She exhibits no edema.  Lymphadenopathy:    She has no cervical adenopathy.  Neurological: She is alert. Coordination normal.  Skin: Skin is warm and dry. No rash noted. She is not diaphoretic. No pallor.  Psychiatric: She has a normal mood and affect.  Nursing note and vitals reviewed.    ED Treatments / Results  Labs (all labs ordered are listed, but only abnormal results are displayed) Labs Reviewed  WET PREP, GENITAL - Abnormal; Notable for the following components:      Result Value   WBC, Wet Prep HPF POC RARE (*)    All other components within normal limits  COMPREHENSIVE METABOLIC PANEL - Abnormal; Notable for the following components:   Potassium 3.4 (*)    Glucose, Bld 104 (*)    Total Protein 9.2 (*)    All other components within normal limits  CBC - Abnormal; Notable for the following components:   WBC 11.3 (*)    All other components within normal limits  URINALYSIS, ROUTINE W REFLEX MICROSCOPIC - Abnormal; Notable for the following components:   APPearance HAZY (*)    Hgb urine dipstick LARGE (*)    Ketones, ur 80 (*)    Protein, ur 30 (*)     RBC / HPF >50 (*)    All other components within normal limits  LIPASE, BLOOD  I-STAT BETA HCG BLOOD, ED ([facility], [facility], [facility] ONLY)  GC/CHLAMYDIA PROBE AMP ([place]) NOT AT [facility]    EKG None  Radiology No results found.  Procedures Procedures (including critical care time)  Medications Ordered in ED Medications  sodium chloride 0.9 % bolus 1,000 mL (0 mLs Intravenous Stopped 11/09/17 1338)  morphine 4 MG/ML injection 4 mg (4 mg Intravenous Given 11/09/17 1131)  ondansetron (ZOFRAN) injection 4 mg (4 mg Intravenous Given 11/09/17 1131)     Initial Impression /  Assessment and Plan / ED Course  I have reviewed the triage vital signs and the nursing notes.  Pertinent labs & imaging results that were available during my care of the patient were reviewed by me and considered in my medical decision making (see chart for details).     Patient with suspected viral gastroenteritis.  WBC shows 11.3.  CMP shows potassium 3.4, protein 9.2, lipase 32.  UA shows large hematuria, 80 ketones, 30 protein.  Patient is on menstrual cycle.  hCG negative.  Wet prep negative.  Patient feeling much better after IV fluids, morphine, Zofran.  She is tolerating oral fluids and eating saltine crackers.  Repeat abdominal exam is benign without focal tenderness.  Using shared decision making, will send patient home with supportive treatment with strict return precautions.  No indication for CT at this time.  Patient is very well-appearing at discharge.  Will discharge home with Zofran, Pepcid.  Progressive diet discussed.  Patient understands and agrees with plan.  Patient vitals stable throughout ED course and discharged in satisfactory condition.  Final Clinical Impressions(s) / ED Diagnoses   Final diagnoses:  Nausea vomiting and diarrhea  Gastroenteritis    ED Discharge Orders        Ordered    famotidine (PEPCID) 20 MG tablet  2 times daily     11/09/17 1403    ondansetron (ZOFRAN) 4 MG tablet  Every 6  hours     11/09/17 1403       Emi Holes, PA-C 11/09/17 1539    Mesner, Barbara Cower, MD 11/09/17 [phone]

## 2017-11-12 LAB — GC/CHLAMYDIA PROBE AMP (~~LOC~~) NOT AT ARMC
Chlamydia: NEGATIVE
Neisseria Gonorrhea: NEGATIVE

## 2017-12-17 ENCOUNTER — Other Ambulatory Visit: Payer: Self-pay

## 2017-12-17 ENCOUNTER — Encounter (HOSPITAL_COMMUNITY): Payer: Self-pay | Admitting: Emergency Medicine

## 2017-12-17 ENCOUNTER — Emergency Department (HOSPITAL_COMMUNITY)
Admission: EM | Admit: 2017-12-17 | Discharge: 2017-12-17 | Disposition: A | Discharge status: Home / Self Care | Payer: BLUE CROSS/BLUE SHIELD | Attending: Emergency Medicine | Admitting: Emergency Medicine

## 2017-12-17 DIAGNOSIS — Z79899 Other long term (current) drug therapy: Secondary | ICD-10-CM | POA: Insufficient documentation

## 2017-12-17 DIAGNOSIS — J029 Acute pharyngitis, unspecified: Secondary | ICD-10-CM

## 2017-12-17 LAB — GROUP A STREP BY PCR: GROUP A STREP BY PCR: NOT DETECTED

## 2017-12-17 NOTE — Discharge Instructions (Signed)
Strep test was negative today. You may continue using Tylenol, Ibuprofen and salt water gargles for symptom relief.

## 2017-12-17 NOTE — ED Provider Notes (Signed)
[facility]-EMERGENCY DEPT Provider Note  CSN: [id number] Arrival date & time: 12/17/17  1146   History   Chief Complaint Chief Complaint  Patient presents with  . Sore Throat    HPI Krista Barnes is a 27 y.o. female with no significant medical history who presented to the ED for sore throat x5 days. Associated symptoms: rhinorrhea, postnasal drip and conjunctivitis (but resolved 2 days ago.) She endorses recent sick contact who had similar symptoms within the last week. Patient has tried ibuprofen and salt water gargle prior to coming to the ED. Denies fever, trismus, drooling, voice change, dysphagia and dyspnea.  Past Medical History:  Diagnosis Date  . Medical history non-contributory     There are no active problems to display for this patient.   Past Surgical History:  Procedure Laterality Date  . NO PAST SURGERIES       OB History    Gravida  0   Para      Term      Preterm      AB      Living        SAB      TAB      Ectopic      Multiple      Live Births               Home Medications    Prior to Admission medications   Medication Sig Start Date End Date Taking? Authorizing Provider  acetaminophen (TYLENOL) 500 MG tablet Take 500 mg by mouth every 6 (six) hours as needed for moderate pain.    [provider]  cyclobenzaprine (FLEXERIL) 5 MG tablet Take 1 tablet (5 mg total) by mouth at bedtime as needed for muscle spasms. Patient not taking: Reported on 11/09/2017 12/11/13   Rodolph Bong, MD  diclofenac (VOLTAREN) 50 MG EC tablet Take 1 tablet (50 mg total) by mouth 2 (two) times daily as needed. Patient not taking: Reported on 11/09/2017 12/11/13   Rodolph Bong, MD  famotidine (PEPCID) 20 MG tablet Take 1 tablet (20 mg total) by mouth 2 (two) times daily. 11/09/17   Law, Waylan Boga, PA-C  ibuprofen (ADVIL,MOTRIN) 800 MG tablet Take 1 tablet (800 mg total) by mouth 3 (three) times daily. Patient not taking:  Reported on 11/09/2017 01/22/14   Jean Rosenthal, NP  norgestimate-ethinyl estradiol (ORTHO-CYCLEN,SPRINTEC,PREVIFEM) 0.25-35 MG-MCG tablet Take 1 tablet by mouth daily. 01/22/14   Jean Rosenthal, NP  ondansetron (ZOFRAN) 4 MG tablet Take 1 tablet (4 mg total) by mouth every 6 (six) hours. 11/09/17   Emi Holes, PA-C    Family History Family History  Problem Relation Age of Onset  . Hypertension Mother   . Hypertension Maternal Aunt   . Cancer Maternal Aunt   . Cancer Maternal Grandfather     Social History Social History   Tobacco Use  . Smoking status: Never Smoker  . Smokeless tobacco: Never Used  Substance Use Topics  . Alcohol use: No  . Drug use: No     Allergies   Penicillins   Review of Systems Review of Systems  Constitutional: Negative for activity change, appetite change, chills and fever.  HENT: Positive for congestion, postnasal drip, rhinorrhea and sore throat. Negative for drooling, ear pain, sinus pressure, sinus pain, trouble swallowing and voice change.   Eyes: Negative for pain, redness, itching and visual disturbance.  Respiratory: Negative for cough, chest tightness and shortness of breath.  Genitourinary: Negative.   Musculoskeletal: Negative.   Skin: Negative.      Physical Exam Updated Vital Signs BP (!) 121/95 (BP Location: Left Arm)   Pulse 72   Temp 98.8 F (37.1 C) (Oral)   Resp 16   LMP 12/10/2017   SpO2 100%   Physical Exam  Constitutional: Vital signs are normal. She appears well-developed and well-nourished. She is cooperative. She does not appear ill. No distress.  HENT:  Right Ear: Tympanic membrane, external ear and ear canal normal.  Left Ear: Tympanic membrane, external ear and ear canal normal.  Nose: Right sinus exhibits no maxillary sinus tenderness and no frontal sinus tenderness. Left sinus exhibits no maxillary sinus tenderness and no frontal sinus tenderness.  Mouth/Throat: Uvula is midline and mucous  membranes are normal. Posterior oropharyngeal edema present. No posterior oropharyngeal erythema or tonsillar abscesses. No tonsillar exudate.  Eyes: Pupils are equal, round, and reactive to light. Conjunctivae, EOM and lids are normal.  Cardiovascular: Normal rate, regular rhythm, normal heart sounds and intact distal pulses.  Pulmonary/Chest: Effort normal and breath sounds normal.  Nursing note and vitals reviewed.    ED Treatments / Results  Labs (all labs ordered are listed, but only abnormal results are displayed) Labs Reviewed  GROUP A STREP BY PCR    EKG None  Radiology No results found.  Procedures Procedures (including critical care time)  Medications Ordered in ED Medications - No data to display   Initial Impression / Assessment and Plan / ED Course  Triage vital signs and the nursing notes have been reviewed.  Pertinent labs & imaging results that were available during care of the patient were reviewed and considered in medical decision making (see chart for details).  Patient presents in no acute distress, afebrile and is well appearing. Physical exam was only significant for tonsillar swelling. No peritonsillar abscess. Fortunately, there was no trismus, drooling, stridor or other findings that increased concern for a posterior neck obstruction that required imaging today. History is consistent with URI of viral etiology.  Clinical Course as of Dec 18 1514  Mon Dec 17, 2017  1410 Strep test negative. Likely viral etiology   [GM]    Clinical Course User Index [GM] Mortis, Sharyon MedicusGabrielle I, PA-C   Final Clinical Impressions(s) / ED Diagnoses  1. Pharyngitis. Viral etiology. Strep test negative. Education provided on OTC and supportive treatments for symptom relief.   Dispo: Home. After thorough clinical evaluation, this patient is determined to be medically stable and can be safely discharged with the previously mentioned treatment and/or outpatient  follow-up/referral(s). At this time, there are no other apparent medical conditions that require further screening, evaluation or treatment.   Final diagnoses:  Pharyngitis, unspecified etiology    ED Discharge Orders    None        Windy CarinaMortis, Gabrielle I, PA-C 12/17/17 1516    Rolan BuccoBelfi, Melanie, MD 12/17/17 [phone]

## 2017-12-17 NOTE — ED Notes (Signed)
Bed: WTR8 Expected date:  Expected time:  Means of arrival:  Comments: 

## 2017-12-17 NOTE — ED Triage Notes (Signed)
Sore throat for 5 days.

## 2018-05-05 ENCOUNTER — Emergency Department (HOSPITAL_COMMUNITY)
Admission: EM | Admit: 2018-05-05 | Discharge: 2018-05-06 | Disposition: A | Discharge status: Home / Self Care | Payer: Self-pay | Attending: Emergency Medicine | Admitting: Emergency Medicine

## 2018-05-05 ENCOUNTER — Encounter (HOSPITAL_COMMUNITY): Payer: Self-pay | Admitting: Emergency Medicine

## 2018-05-05 ENCOUNTER — Other Ambulatory Visit: Payer: Self-pay

## 2018-05-05 ENCOUNTER — Emergency Department (HOSPITAL_COMMUNITY): Payer: Self-pay

## 2018-05-05 DIAGNOSIS — R1032 Left lower quadrant pain: Secondary | ICD-10-CM | POA: Insufficient documentation

## 2018-05-05 DIAGNOSIS — R112 Nausea with vomiting, unspecified: Secondary | ICD-10-CM | POA: Insufficient documentation

## 2018-05-05 DIAGNOSIS — Z79899 Other long term (current) drug therapy: Secondary | ICD-10-CM | POA: Insufficient documentation

## 2018-05-05 LAB — COMPREHENSIVE METABOLIC PANEL
ALBUMIN: 4.4 g/dL (ref 3.5–5.0)
ALT: 15 U/L (ref 0–44)
ANION GAP: 8 (ref 5–15)
AST: 22 U/L (ref 15–41)
Alkaline Phosphatase: 59 U/L (ref 38–126)
BILIRUBIN TOTAL: 0.6 mg/dL (ref 0.3–1.2)
BUN: 12 mg/dL (ref 6–20)
CO2: 26 mmol/L (ref 22–32)
Calcium: 9.1 mg/dL (ref 8.9–10.3)
Chloride: 106 mmol/L (ref 98–111)
Creatinine, Ser: 0.68 mg/dL (ref 0.44–1.00)
GFR calc Af Amer: >60 mL/min (ref >60)
GFR calc non Af Amer: >60 mL/min (ref >60)
GLUCOSE: 102 mg/dL — AB (ref 70–99)
POTASSIUM: 3.5 mmol/L (ref 3.5–5.1)
Sodium: 140 mmol/L (ref 135–145)
TOTAL PROTEIN: 8.6 g/dL — AB (ref 6.5–8.1)

## 2018-05-05 LAB — LIPASE, BLOOD: Lipase: 26 U/L (ref 11–51)

## 2018-05-05 LAB — CBC
HCT: 41.4 % (ref 36.0–46.0)
HEMOGLOBIN: 13.7 g/dL (ref 12.0–15.0)
MCH: 30.8 pg (ref 26.0–34.0)
MCHC: 33.1 g/dL (ref 30.0–36.0)
MCV: 93 fL (ref 80.0–100.0)
PLATELETS: 237 10*3/uL (ref 150–400)
RBC: 4.45 MIL/uL (ref 3.87–5.11)
RDW: 12.3 % (ref 11.5–15.5)
WBC: 8.8 10*3/uL (ref 4.0–10.5)
nRBC: 0 % (ref 0.0–0.2)

## 2018-05-05 LAB — URINALYSIS, ROUTINE W REFLEX MICROSCOPIC
BILIRUBIN URINE: NEGATIVE
Bacteria, UA: NONE SEEN
Glucose, UA: NEGATIVE mg/dL
Ketones, ur: 80 mg/dL — AB
NITRITE: NEGATIVE
PH: 7 (ref 5.0–8.0)
Protein, ur: 30 mg/dL — AB
RBC / HPF: >50 RBC/hpf — ABNORMAL HIGH (ref 0–5)
SPECIFIC GRAVITY, URINE: 1.029 (ref 1.005–1.030)

## 2018-05-05 LAB — I-STAT BETA HCG BLOOD, ED (MC, WL, AP ONLY)

## 2018-05-05 MED ORDER — ONDANSETRON HCL 4 MG PO TABS: 4.0000 mg | ORAL_TABLET | Freq: Four times a day (QID) | ORAL | 0 refills | Status: AC

## 2018-05-05 MED ORDER — PROMETHAZINE HCL 25 MG RE SUPP: 25.0000 mg | Freq: Four times a day (QID) | RECTAL | 0 refills | Status: AC | PRN

## 2018-05-05 MED ORDER — PROMETHAZINE HCL 25 MG/ML IJ SOLN
25.0000 mg | Freq: Once | INTRAMUSCULAR | Status: AC
  Administered 2018-05-05: 25 mg via INTRAVENOUS
  Filled 2018-05-05: qty 1

## 2018-05-05 MED ORDER — ONDANSETRON HCL 4 MG/2ML IJ SOLN
4.0000 mg | Freq: Once | INTRAMUSCULAR | Status: AC
  Administered 2018-05-05: 4 mg via INTRAVENOUS
  Filled 2018-05-05: qty 2

## 2018-05-05 MED ORDER — MORPHINE SULFATE (PF) 4 MG/ML IV SOLN
4.0000 mg | Freq: Once | INTRAVENOUS | Status: AC
  Administered 2018-05-05: 4 mg via INTRAVENOUS
  Filled 2018-05-05: qty 1

## 2018-05-05 MED ORDER — SODIUM CHLORIDE 0.9 % IV BOLUS
1000.0000 mL | Freq: Once | INTRAVENOUS | Status: AC
  Administered 2018-05-05: 1000 mL via INTRAVENOUS

## 2018-05-05 NOTE — ED Notes (Signed)
Patient transported to CT 

## 2018-05-05 NOTE — ED Provider Notes (Signed)
[facility]-EMERGENCY DEPT Provider Note   CSN: [id number] Arrival date & time: 05/05/18  2024     History   Chief Complaint Chief Complaint  Patient presents with  . Abdominal Pain  . Emesis    HPI Krista Barnes is a 27 y.o. female with a history of nephrolithiasis and metromenorrhagia presents to the emergency department with a chief complaint of vomiting.  The patient endorses multiple episodes of nonbilious emesis, nausea, non-bloody diarrhea x1, and LLQ abdominal pain, onset 1300.  States she noticed a small amount of bright red blood with vomiting. She characterizes the pain as a sharp nonradiating.  Pain is been constant since onset.  She states she has had a difficult time getting comfortable.  No known aggravating or alleviating factors.  She reports her menstrual cycle also began today.  She reports that history of painful menstrual cycles and states that almost every other month that she will have vomiting and cramping abdominal pain at the beginning of her cycle.  She also notes a history of kidney stones, states that her pain today feels more consistent with when she has had a kidney stone.  She denies a fever, chills, chest pain, dyspnea, rash, hematuria, dysuria, vaginal discharge, melena, or hematochezia.  Attempted to take ibuprofen for her pain prior to arrival, but was unable to keep medication down.  She is sexually active with one female partner. Denies concern for pregnancy.   The history is provided by the patient. No language interpreter was used.    Past Medical History:  Diagnosis Date  . Medical history non-contributory     There are no active problems to display for this patient.   Past Surgical History:  Procedure Laterality Date  . NO PAST SURGERIES       OB History    Gravida  0   Para      Term      Preterm      AB      Living        SAB      TAB      Ectopic      Multiple      Live Births                 Home Medications    Prior to Admission medications   Medication Sig Start Date End Date Taking? Authorizing Provider  norgestimate-ethinyl estradiol (ORTHO-CYCLEN,SPRINTEC,PREVIFEM) 0.25-35 MG-MCG tablet Take 1 tablet by mouth daily. 01/22/14   Jean Rosenthal, NP  ondansetron (ZOFRAN) 4 MG tablet Take 1 tablet (4 mg total) by mouth every 6 (six) hours. 05/05/18   Naomi Castrogiovanni A, PA-C  promethazine (PHENERGAN) 25 MG suppository Place 1 suppository (25 mg total) rectally every 6 (six) hours as needed for nausea or vomiting. 05/05/18   Jaymarion Trombly, Coral Else, PA-C    Family History Family History  Problem Relation Age of Onset  . Hypertension Mother   . Hypertension Maternal Aunt   . Cancer Maternal Aunt   . Cancer Maternal Grandfather     Social History Social History   Tobacco Use  . Smoking status: Never Smoker  . Smokeless tobacco: Never Used  Substance Use Topics  . Alcohol use: No  . Drug use: No     Allergies   Penicillins   Review of Systems Review of Systems  Constitutional: Negative for activity change, chills and fever.  Respiratory: Negative for cough, shortness of breath and wheezing.   Cardiovascular:  Negative for chest pain.  Gastrointestinal: Positive for nausea and vomiting. Negative for abdominal distention, abdominal pain, anal bleeding, blood in stool, constipation and diarrhea.  Genitourinary: Positive for menstrual problem and vaginal bleeding. Negative for dysuria, flank pain, frequency, hematuria, pelvic pain and urgency.  Musculoskeletal: Negative for back pain.  Skin: Negative for color change, rash and wound.  Allergic/Immunologic: Negative for immunocompromised state.  Neurological: Negative for seizures, weakness, numbness and headaches.  Psychiatric/Behavioral: Negative for confusion.     Physical Exam Updated Vital Signs BP 113/72   Pulse 84   Temp (!) 97.2 F (36.2 C)   Resp 16   Ht 5\' 4"  (1.626 m)   Wt 49.9 kg    LMP 05/05/2018   SpO2 98%   BMI 18.88 kg/m   Physical Exam  Constitutional: She is oriented to person, place, and time.  Non-toxic appearance. She does not appear ill. No distress.  She does not appear toxic.  HENT:  Head: Normocephalic.  Eyes: Conjunctivae are normal.  Neck: Normal range of motion. Neck supple.  Cardiovascular: Normal rate, regular rhythm, normal heart sounds and intact distal pulses. Exam reveals no gallop and no friction rub.  No murmur heard. Pulmonary/Chest: Effort normal. No stridor. No respiratory distress. She has no wheezes. She has no rales. She exhibits no tenderness.  Abdominal: Soft. Bowel sounds are normal. She exhibits no distension and no mass. There is tenderness. There is no rebound and no guarding. No hernia.  Tender to palpation in the left lower quadrant without rebound or guarding.  The remainder of the abdominal exam is unremarkable.  Abdomen is soft, nondistended.  No CVA tenderness bilaterally.  Musculoskeletal: She exhibits no edema, tenderness or deformity.  Neurological: She is alert and oriented to person, place, and time.  Skin: Skin is warm. No rash noted. She is not diaphoretic.  Psychiatric: Her behavior is normal.  Nursing note and vitals reviewed.    ED Treatments / Results  Labs (all labs ordered are listed, but only abnormal results are displayed) Labs Reviewed  COMPREHENSIVE METABOLIC PANEL - Abnormal; Notable for the following components:      Result Value   Glucose, Bld 102 (*)    Total Protein 8.6 (*)    All other components within normal limits  URINALYSIS, ROUTINE W REFLEX MICROSCOPIC - Abnormal; Notable for the following components:   APPearance HAZY (*)    Hgb urine dipstick LARGE (*)    Ketones, ur 80 (*)    Protein, ur 30 (*)    Leukocytes, UA TRACE (*)    RBC / HPF >50 (*)    All other components within normal limits  URINE CULTURE  LIPASE, BLOOD  CBC  I-STAT BETA HCG BLOOD, ED ([facility], [facility], [facility] ONLY)     EKG None  Radiology Ct Renal Stone Study  Result Date: 05/05/2018 CLINICAL DATA:  Flank pain.  Nephrolithiasis. EXAM: CT ABDOMEN AND PELVIS WITHOUT CONTRAST TECHNIQUE: Multidetector CT imaging of the abdomen and pelvis was performed following the standard protocol without IV contrast. COMPARISON:  06/16/2012 FINDINGS: Lower chest: No acute findings. Hepatobiliary: No mass visualized on this unenhanced exam. Gallbladder is unremarkable. Pancreas: No mass or inflammatory process visualized on this unenhanced exam. Spleen:  Within normal limits in size. Adrenals/Urinary tract: Tiny less than 5 mm calculi are again seen in both kidneys. No evidence of ureteral calculi or hydronephrosis. Unremarkable unopacified urinary bladder. Stomach/Bowel: No evidence of obstruction, inflammatory process, or abnormal fluid collections. Normal appendix visualized. Vascular/Lymphatic: No  pathologically enlarged lymph nodes identified. No evidence of abdominal aortic aneurysm. Reproductive:  No mass or other significant abnormality. Other:  None. Musculoskeletal:  No suspicious bone lesions identified. IMPRESSION: Bilateral nephrolithiasis. No evidence of ureteral calculi, hydronephrosis, or other acute findings. Electronically Signed   By: Myles Rosenthal M.D.   On: 05/05/2018 23:11    Procedures Procedures (including critical care time)  Medications Ordered in ED Medications  ondansetron (ZOFRAN) injection 4 mg (4 mg Intravenous Given 05/05/18 2133)  sodium chloride 0.9 % bolus 1,000 mL (0 mLs Intravenous Stopped 05/05/18 2244)  morphine 4 MG/ML injection 4 mg (4 mg Intravenous Given 05/05/18 2134)  sodium chloride 0.9 % bolus 1,000 mL (0 mLs Intravenous Stopped 05/05/18 2321)  promethazine (PHENERGAN) injection 25 mg (25 mg Intravenous Given 05/05/18 2319)     Initial Impression / Assessment and Plan / ED Course  I have reviewed the triage vital signs and the nursing notes.  Pertinent labs & imaging  results that were available during my care of the patient were reviewed by me and considered in my medical decision making (see chart for details).     Patient is nontoxic, nonseptic appearing, in no apparent distress.  Patient's pain and other symptoms adequately managed in emergency department.  Fluid bolus given.  Labs, imaging and vitals reviewed.  Patient does not meet the SIRS or Sepsis criteria.  On repeat exam patient does not have a surgical abdomen and there are no peritoneal signs.  No indication of appendicitis, bowel obstruction, bowel perforation, cholecystitis, diverticulitis, PID or ectopic pregnancy.  Patient discharged home with symptomatic treatment and given strict instructions for follow-up with their primary care physician.  I have also discussed reasons to return immediately to the ER.  Patient expresses understanding and agrees with plan.  Final Clinical Impressions(s) / ED Diagnoses   Final diagnoses:  Non-intractable vomiting with nausea, unspecified vomiting type    ED Discharge Orders         Ordered    promethazine (PHENERGAN) 25 MG suppository  Every 6 hours PRN     05/05/18 2356    ondansetron (ZOFRAN) 4 MG tablet  Every 6 hours     05/05/18 2356           Kynsley Whitehouse A, PA-C 05/06/18 0158    Melene Plan, DO 05/07/18 1121

## 2018-05-05 NOTE — ED Triage Notes (Signed)
Patient c/o LLQ pain with N/V/D since 1300.

## 2018-05-06 NOTE — Discharge Instructions (Signed)
Thank you for allowing me to care for you today in the Emergency Department.   You can take one tablet of Zofran by mouth every 6 hours for nausea and vomiting or you can try inserting one phenergan suppository in your rectum once every 6 hours if you are unable to keep the Zofran tablets down.  Take 650 mg of Tylenol or 600 mg of ibuprofen with food every 6 hours for pain.  Follow-up with your OB/GYN if your periods continue to be heavy and very painful.  Return to the emergency department if you develop persistent vomiting despite using Zofran and Phenergan, if you stop making urine or if you develop other new, concerning symptoms.

## 2018-05-07 LAB — URINE CULTURE: CULTURE: NO GROWTH
